# Patient Record
Sex: Female | Born: 1950 | Race: White | Hispanic: No | Marital: Married | Smoking: Never smoker
Health system: Southern US, Community
[De-identification: ages and names within clinical notes are randomized; demographics above are authoritative.]

## PROBLEM LIST (undated history)

## (undated) DIAGNOSIS — E079 Disorder of thyroid, unspecified: Secondary | ICD-10-CM

## (undated) DIAGNOSIS — E559 Vitamin D deficiency, unspecified: Secondary | ICD-10-CM

## (undated) DIAGNOSIS — I471 Supraventricular tachycardia, unspecified: Secondary | ICD-10-CM

## (undated) HISTORY — DX: Disorder of thyroid, unspecified: E07.9

## (undated) HISTORY — DX: Vitamin D deficiency, unspecified: E55.9

---

## 2000-06-01 ENCOUNTER — Other Ambulatory Visit: Admission: RE | Admit: 2000-06-01 | Discharge: 2000-06-01 | Payer: Self-pay | Admitting: *Deleted

## 2001-09-01 ENCOUNTER — Other Ambulatory Visit: Admission: RE | Admit: 2001-09-01 | Discharge: 2001-09-01 | Payer: Self-pay | Admitting: *Deleted

## 2003-07-17 ENCOUNTER — Other Ambulatory Visit: Admission: RE | Admit: 2003-07-17 | Discharge: 2003-07-17 | Payer: Self-pay | Admitting: *Deleted

## 2013-12-20 DIAGNOSIS — N952 Postmenopausal atrophic vaginitis: Secondary | ICD-10-CM | POA: Diagnosis not present

## 2013-12-20 DIAGNOSIS — N950 Postmenopausal bleeding: Secondary | ICD-10-CM | POA: Diagnosis not present

## 2013-12-21 DIAGNOSIS — N950 Postmenopausal bleeding: Secondary | ICD-10-CM | POA: Diagnosis not present

## 2014-01-08 DIAGNOSIS — Z23 Encounter for immunization: Secondary | ICD-10-CM | POA: Diagnosis not present

## 2014-01-12 DIAGNOSIS — N3020 Other chronic cystitis without hematuria: Secondary | ICD-10-CM | POA: Diagnosis not present

## 2014-01-12 DIAGNOSIS — R319 Hematuria, unspecified: Secondary | ICD-10-CM | POA: Diagnosis not present

## 2014-01-16 DIAGNOSIS — N841 Polyp of cervix uteri: Secondary | ICD-10-CM | POA: Diagnosis not present

## 2014-01-16 DIAGNOSIS — N950 Postmenopausal bleeding: Secondary | ICD-10-CM | POA: Diagnosis not present

## 2014-01-17 DIAGNOSIS — N841 Polyp of cervix uteri: Secondary | ICD-10-CM | POA: Diagnosis not present

## 2014-02-14 DIAGNOSIS — E559 Vitamin D deficiency, unspecified: Secondary | ICD-10-CM | POA: Diagnosis not present

## 2014-02-14 DIAGNOSIS — E039 Hypothyroidism, unspecified: Secondary | ICD-10-CM | POA: Diagnosis not present

## 2014-06-13 DIAGNOSIS — M24573 Contracture, unspecified ankle: Secondary | ICD-10-CM | POA: Diagnosis not present

## 2014-06-13 DIAGNOSIS — M898X7 Other specified disorders of bone, ankle and foot: Secondary | ICD-10-CM | POA: Diagnosis not present

## 2014-06-13 DIAGNOSIS — M7660 Achilles tendinitis, unspecified leg: Secondary | ICD-10-CM | POA: Diagnosis not present

## 2014-06-18 DIAGNOSIS — R262 Difficulty in walking, not elsewhere classified: Secondary | ICD-10-CM | POA: Diagnosis not present

## 2014-06-18 DIAGNOSIS — M7661 Achilles tendinitis, right leg: Secondary | ICD-10-CM | POA: Diagnosis not present

## 2014-06-18 DIAGNOSIS — M25571 Pain in right ankle and joints of right foot: Secondary | ICD-10-CM | POA: Diagnosis not present

## 2014-06-20 DIAGNOSIS — R262 Difficulty in walking, not elsewhere classified: Secondary | ICD-10-CM | POA: Diagnosis not present

## 2014-06-20 DIAGNOSIS — M7661 Achilles tendinitis, right leg: Secondary | ICD-10-CM | POA: Diagnosis not present

## 2014-06-20 DIAGNOSIS — M25571 Pain in right ankle and joints of right foot: Secondary | ICD-10-CM | POA: Diagnosis not present

## 2014-06-22 DIAGNOSIS — M7661 Achilles tendinitis, right leg: Secondary | ICD-10-CM | POA: Diagnosis not present

## 2014-06-22 DIAGNOSIS — R262 Difficulty in walking, not elsewhere classified: Secondary | ICD-10-CM | POA: Diagnosis not present

## 2014-06-22 DIAGNOSIS — M25571 Pain in right ankle and joints of right foot: Secondary | ICD-10-CM | POA: Diagnosis not present

## 2014-06-26 DIAGNOSIS — R262 Difficulty in walking, not elsewhere classified: Secondary | ICD-10-CM | POA: Diagnosis not present

## 2014-06-26 DIAGNOSIS — M7661 Achilles tendinitis, right leg: Secondary | ICD-10-CM | POA: Diagnosis not present

## 2014-06-26 DIAGNOSIS — M25571 Pain in right ankle and joints of right foot: Secondary | ICD-10-CM | POA: Diagnosis not present

## 2014-06-28 DIAGNOSIS — M7661 Achilles tendinitis, right leg: Secondary | ICD-10-CM | POA: Diagnosis not present

## 2014-06-28 DIAGNOSIS — M25571 Pain in right ankle and joints of right foot: Secondary | ICD-10-CM | POA: Diagnosis not present

## 2014-06-28 DIAGNOSIS — R262 Difficulty in walking, not elsewhere classified: Secondary | ICD-10-CM | POA: Diagnosis not present

## 2014-07-05 DIAGNOSIS — R262 Difficulty in walking, not elsewhere classified: Secondary | ICD-10-CM | POA: Diagnosis not present

## 2014-07-05 DIAGNOSIS — M7661 Achilles tendinitis, right leg: Secondary | ICD-10-CM | POA: Diagnosis not present

## 2014-07-05 DIAGNOSIS — M25571 Pain in right ankle and joints of right foot: Secondary | ICD-10-CM | POA: Diagnosis not present

## 2014-07-09 DIAGNOSIS — M25571 Pain in right ankle and joints of right foot: Secondary | ICD-10-CM | POA: Diagnosis not present

## 2014-07-09 DIAGNOSIS — R262 Difficulty in walking, not elsewhere classified: Secondary | ICD-10-CM | POA: Diagnosis not present

## 2014-07-09 DIAGNOSIS — M7661 Achilles tendinitis, right leg: Secondary | ICD-10-CM | POA: Diagnosis not present

## 2014-07-12 DIAGNOSIS — M7661 Achilles tendinitis, right leg: Secondary | ICD-10-CM | POA: Diagnosis not present

## 2014-07-12 DIAGNOSIS — M25571 Pain in right ankle and joints of right foot: Secondary | ICD-10-CM | POA: Diagnosis not present

## 2014-07-12 DIAGNOSIS — R262 Difficulty in walking, not elsewhere classified: Secondary | ICD-10-CM | POA: Diagnosis not present

## 2014-07-16 DIAGNOSIS — M7661 Achilles tendinitis, right leg: Secondary | ICD-10-CM | POA: Diagnosis not present

## 2014-07-16 DIAGNOSIS — R262 Difficulty in walking, not elsewhere classified: Secondary | ICD-10-CM | POA: Diagnosis not present

## 2014-07-16 DIAGNOSIS — M25571 Pain in right ankle and joints of right foot: Secondary | ICD-10-CM | POA: Diagnosis not present

## 2014-07-18 DIAGNOSIS — M25571 Pain in right ankle and joints of right foot: Secondary | ICD-10-CM | POA: Diagnosis not present

## 2014-07-18 DIAGNOSIS — R262 Difficulty in walking, not elsewhere classified: Secondary | ICD-10-CM | POA: Diagnosis not present

## 2014-07-18 DIAGNOSIS — M7661 Achilles tendinitis, right leg: Secondary | ICD-10-CM | POA: Diagnosis not present

## 2014-07-24 DIAGNOSIS — M7661 Achilles tendinitis, right leg: Secondary | ICD-10-CM | POA: Diagnosis not present

## 2014-07-24 DIAGNOSIS — M25571 Pain in right ankle and joints of right foot: Secondary | ICD-10-CM | POA: Diagnosis not present

## 2014-07-24 DIAGNOSIS — R262 Difficulty in walking, not elsewhere classified: Secondary | ICD-10-CM | POA: Diagnosis not present

## 2014-08-15 DIAGNOSIS — M6701 Short Achilles tendon (acquired), right ankle: Secondary | ICD-10-CM | POA: Diagnosis not present

## 2014-08-15 DIAGNOSIS — M2011 Hallux valgus (acquired), right foot: Secondary | ICD-10-CM | POA: Diagnosis not present

## 2014-08-15 DIAGNOSIS — M7661 Achilles tendinitis, right leg: Secondary | ICD-10-CM | POA: Diagnosis not present

## 2014-08-15 DIAGNOSIS — M9261 Juvenile osteochondrosis of tarsus, right ankle: Secondary | ICD-10-CM | POA: Diagnosis not present

## 2014-08-28 DIAGNOSIS — M7661 Achilles tendinitis, right leg: Secondary | ICD-10-CM | POA: Diagnosis not present

## 2014-09-11 DIAGNOSIS — M7661 Achilles tendinitis, right leg: Secondary | ICD-10-CM | POA: Diagnosis not present

## 2014-12-20 DIAGNOSIS — Z23 Encounter for immunization: Secondary | ICD-10-CM | POA: Diagnosis not present

## 2015-02-18 DIAGNOSIS — R319 Hematuria, unspecified: Secondary | ICD-10-CM | POA: Diagnosis not present

## 2015-02-18 DIAGNOSIS — E785 Hyperlipidemia, unspecified: Secondary | ICD-10-CM | POA: Diagnosis not present

## 2015-02-18 DIAGNOSIS — E559 Vitamin D deficiency, unspecified: Secondary | ICD-10-CM | POA: Diagnosis not present

## 2015-02-18 DIAGNOSIS — E039 Hypothyroidism, unspecified: Secondary | ICD-10-CM | POA: Diagnosis not present

## 2015-02-26 DIAGNOSIS — E039 Hypothyroidism, unspecified: Secondary | ICD-10-CM | POA: Diagnosis not present

## 2015-02-26 DIAGNOSIS — I491 Atrial premature depolarization: Secondary | ICD-10-CM | POA: Diagnosis not present

## 2015-02-26 DIAGNOSIS — L309 Dermatitis, unspecified: Secondary | ICD-10-CM | POA: Diagnosis not present

## 2015-02-26 DIAGNOSIS — E559 Vitamin D deficiency, unspecified: Secondary | ICD-10-CM | POA: Diagnosis not present

## 2015-02-26 DIAGNOSIS — N390 Urinary tract infection, site not specified: Secondary | ICD-10-CM | POA: Diagnosis not present

## 2015-02-26 DIAGNOSIS — Z1231 Encounter for screening mammogram for malignant neoplasm of breast: Secondary | ICD-10-CM | POA: Diagnosis not present

## 2015-04-23 DIAGNOSIS — Z1231 Encounter for screening mammogram for malignant neoplasm of breast: Secondary | ICD-10-CM | POA: Diagnosis not present

## 2015-04-30 DIAGNOSIS — Z1212 Encounter for screening for malignant neoplasm of rectum: Secondary | ICD-10-CM | POA: Diagnosis not present

## 2015-04-30 DIAGNOSIS — Z1211 Encounter for screening for malignant neoplasm of colon: Secondary | ICD-10-CM | POA: Diagnosis not present

## 2015-06-18 DIAGNOSIS — S99921A Unspecified injury of right foot, initial encounter: Secondary | ICD-10-CM | POA: Diagnosis not present

## 2015-06-18 DIAGNOSIS — S93401A Sprain of unspecified ligament of right ankle, initial encounter: Secondary | ICD-10-CM | POA: Diagnosis not present

## 2015-07-24 DIAGNOSIS — E785 Hyperlipidemia, unspecified: Secondary | ICD-10-CM | POA: Diagnosis not present

## 2015-07-24 DIAGNOSIS — E559 Vitamin D deficiency, unspecified: Secondary | ICD-10-CM | POA: Diagnosis not present

## 2015-07-24 DIAGNOSIS — E039 Hypothyroidism, unspecified: Secondary | ICD-10-CM | POA: Diagnosis not present

## 2015-10-10 ENCOUNTER — Ambulatory Visit (INDEPENDENT_AMBULATORY_CARE_PROVIDER_SITE_OTHER): Payer: Medicare Other | Admitting: Internal Medicine

## 2015-10-10 ENCOUNTER — Encounter: Payer: Self-pay | Admitting: Internal Medicine

## 2015-10-10 VITALS — BP 132/64 | HR 77 | Ht 67.0 in | Wt 194.5 lb

## 2015-10-10 DIAGNOSIS — R002 Palpitations: Secondary | ICD-10-CM | POA: Diagnosis not present

## 2015-10-10 DIAGNOSIS — R55 Syncope and collapse: Secondary | ICD-10-CM

## 2015-10-10 MED ORDER — METOPROLOL SUCCINATE ER 25 MG PO TB24: 25.0000 mg | ORAL_TABLET | Freq: Every day | ORAL | Status: DC

## 2015-10-10 MED ORDER — PROPRANOLOL HCL ER 60 MG PO CP24: 60.0000 mg | ORAL_CAPSULE | Freq: Every day | ORAL | Status: DC

## 2015-10-10 MED ORDER — ATENOLOL 25 MG PO TABS: 25.0000 mg | ORAL_TABLET | Freq: Every day | ORAL | Status: DC

## 2015-10-10 NOTE — Progress Notes (Signed)
ELECTROPHYSIOLOGY CONSULT NOTE  Patient ID: Wanda Freeman, MRN: [id number], DOB/AGE: [birth year] 65 y.o. Admit date: (Not on file) Date of Consult: 10/10/2015  Primary Physician: Ernestene Kiel, MD Primary Cardiologist: new Consulting Physician nine  Chief Complaint: Syncope and PACs      HPI Wanda Freeman is a 65 y.o. female  Seen at the request of her husband (Dr. Lavell Luster Pore) because of atypical chest pain as well as palpitations.  Her palpitations have been long-standing. 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 When these persist for periods of time she gets a chest aching with it. It is unassociated with lightheadedness. Neither she nor her husband has ever felt that she has sustained tachycardia.  These are occurring daily now with increasing duration and increasing discombobulation. They seem to be aggravated by stress but there also notable particularly bothersome at night when she goes to bed. On one occasion in the remote past she took a single dose of metoprolol with some benefit.  More recently she had an episode of quite severe left-sided chest pain. It lasted 2-3 minutes and was followed by nausea and lightheadedness. It was decrescendo. It occurred after having traveled which included an airplane flight from [place].  She has a history of syncope a few months before that which occurred at the beauty [employer]lor. She been washing her hair she stood up and collapsed to the floor. In her mind the event was abrupt in onset as well as offset although there is some residual orthostatic intolerance. She was able to get herself back up into the chair to have her hair finished. She's had no other history of syncope.  Cardiac risk factors are negative for family history cholesterol or cigarette use.     Past Medical History  Diagnosis Date  . Thyroid disease   . Vitamin D  deficiency       Surgical History: History reviewed. No pertinent past surgical history.   Home Meds: Prior to Admission medications   Medication Sig Start Date End Date Taking? Authorizing Provider  atenolol (TENORMIN) 25 MG tablet Take 1 tablet (25 mg total) by mouth daily. 10/10/15   Deboraha Sprang, MD  levothyroxine (SYNTHROID, LEVOTHROID) 100 MCG tablet Take 100 mcg by mouth daily before breakfast.   Yes Historical Provider, MD  metoprolol succinate (TOPROL-XL) 25 MG 24 hr tablet Take 1 tablet (25 mg total) by mouth daily. 10/10/15   Deboraha Sprang, MD  propranolol ER (INDERAL LA) 60 MG 24 hr capsule Take 1 capsule (60 mg total) by mouth daily. 10/10/15   Deboraha Sprang, MD  Vitamin D, Ergocalciferol, (DRISDOL) 50000 units CAPS capsule Take 50,000 Units by mouth every 7 (seven) days.   Yes Historical Provider, MD    Allergies: No Known Allergies  Social History   Social History  . Marital Status: Unknown    Spouse Name: N/A  . Number of Children: N/A  . Years of Education: N/A   Occupational History  . Not on file.   Social History Main Topics  . Smoking status: Never Smoker   . Smokeless tobacco: Not on file  . Alcohol Use: Not on file  . Drug Use: No  . Sexual Activity: Not on file   Other Topics Concern  . Not on file   Social History Narrative  . No narrative on file     Family History  Problem  Relation Age of Onset  . Family history unknown: Yes     ROS:  Please see the history of present illness.     All other systems reviewed and negative.    Physical Exam:   Blood pressure 132/64, pulse 77, height 5\' 7"  (1.702 m), weight 194 lb 8 oz (88.225 kg). General: Well developed, well nourished female in no acute distress. Head: Normocephalic, atraumatic, sclera non-icteric, no xanthomas, nares are without discharge. EENT: normal  Lymph Nodes:  none Neck: Negative for carotid bruits. JVD not elevated. Back:without scoliosis kyphosis  Lungs: Clear bilaterally to  auscultation without wheezes, rales, or rhonchi. Breathing is unlabored. Heart: Some irregular RR with S1 S2. No murmur . No rubs, or gallops appreciated. Abdomen: Soft, non-tender, non-distended with normoactive bowel sounds. No hepatomegaly. No rebound/guarding. No obvious abdominal masses. Msk:  Strength and tone appear normal for age. Extremities: No clubbing or cyanosis. No edema.  Distal pedal pulses are 2+ and equal bilaterally. Skin: Warm and Dry Neuro: Alert and oriented X 3. CN III-XII intact Grossly normal sensory and motor function . Psych:  Responds to questions appropriately with a normal affect.      Labs: Cardiac Enzymes No results for input(s): CKTOTAL, CKMB, TROPONINI in the last 72 hours. CBC No results found for: WBC, HGB, HCT, MCV, PLT PROTIME: No results for input(s): LABPROT, INR in the last 72 hours. Chemistry No results for input(s): NA, K, CL, CO2, BUN, CREATININE, CALCIUM, PROT, BILITOT, ALKPHOS, ALT, AST, GLUCOSE in the last 168 hours.  Invalid input(s): LABALBU Lipids No results found for: CHOL, HDL, LDLCALC, TRIG BNP No results found for: PROBNP Thyroid Function Tests: No results for input(s): TSH, T4TOTAL, T3FREE, THYROIDAB in the last 72 hours.  Invalid input(s): FREET3 Miscellaneous No results found for: DDIMER  Radiology/Studies:  No results found.  EKG: Sinus at 77 Intervals 13/07/37 Otherwise normal  ECG dated 7/15 demonstrated sinus rhythm with PACs and a pattern of trigeminy   Assessment and Plan:  Symptomatic PACs  Chest pain atypical  Syncope   The patient has a long-standing of increasingly symptomatic PACs. They are now a daily problem. We will undertake a beta blocker trial realizing that in the past she has had low blood pressures and we worry a little bit about the impact. I've given her prescriptions for atenolol 25, metoprolol succinate 25 and Inderal LA 60. We have reviewed potential side effects.  Her chest pain is  atypical. However, it was associated with epiphenomena that are worrisome. We will undertake standard treadmill testing  Her syncope story contextually should be vasomotor; however, the abrupt onset offset nature of it raises the possibility of an arrhythmia.  We will undertake an echocardiogram to exclude structural heart disease.       Virl Axe

## 2015-10-10 NOTE — Patient Instructions (Addendum)
Medication Instructions: - Your physician has recommended you make the following change in your medication:  ** You have been given 3 different beta blockers to try. You may take these in any order, but DO NOT take more than one at a time. Please try to give at least 2 weeks on each one to see how you do.**  1) Atenolol 25 mg once daily 2) Metoprolol Succinate 25 mg once daily 3) Inderal LA 60 mg once daily  Labwork: - none  Procedures/Testing: - Your physician has requested that you have an echocardiogram. Echocardiography is a painless test that uses sound waves to create images of your heart. It provides your doctor with information about the size and shape of your heart and how well your heart's chambers and valves are working. This procedure takes approximately one hour. There are no restrictions for this procedure.  - Your physician has requested that you have an exercise tolerance test. For further information please visit [email or web address]. Please also follow instruction sheet, as given.  Prior to your treadmill test: - you may eat/ drink - DO NOT take beta blockers for 24 hours prior to your test, you make take your other medications - please do not wear perfumes/ lotions the morning of your test   Follow-Up: - Your physician recommends that you schedule a follow-up appointment in: 3 months with Dr. Caryl Comes.  Any Additional Special Instructions Will Be Listed Below (If Applicable).     If you need a refill on your cardiac medications before your next appointment, please call your pharmacy.

## 2015-10-31 ENCOUNTER — Ambulatory Visit (INDEPENDENT_AMBULATORY_CARE_PROVIDER_SITE_OTHER): Payer: Medicare Other

## 2015-10-31 ENCOUNTER — Other Ambulatory Visit: Payer: Self-pay

## 2015-10-31 DIAGNOSIS — R002 Palpitations: Secondary | ICD-10-CM

## 2015-10-31 DIAGNOSIS — R55 Syncope and collapse: Secondary | ICD-10-CM

## 2015-10-31 LAB — EXERCISE TOLERANCE TEST
CHL CUP STRESS STAGE 2 DBP: 83 mmHg
CHL CUP STRESS STAGE 2 GRADE: 0 %
CHL CUP STRESS STAGE 3 HR: 93 {beats}/min
CHL CUP STRESS STAGE 4 HR: 129 {beats}/min
CHL CUP STRESS STAGE 5 SBP: 194 mmHg
CHL CUP STRESS STAGE 5 SPEED: 2.5 mph
CHL CUP STRESS STAGE 6 GRADE: 0 %
CHL CUP STRESS STAGE 6 HR: 126 {beats}/min
CHL CUP STRESS STAGE 6 SPEED: 0 mph
CHL CUP STRESS STAGE 7 DBP: 83 mmHg
CHL CUP STRESS STAGE 7 SBP: 155 mmHg
CHL CUP STRESS STAGE 7 SPEED: 0 mph
CSEPHR: 106 %
CSEPPBP: 194 mmHg
CSEPPHR: 162 {beats}/min
CSEPPMHR: 105 %
Estimated workload: 7 METS
Exercise duration (min): 5 min
Exercise duration (sec): 28 s
MPHR: 154 {beats}/min
Rest HR: 78 {beats}/min
Stage 1 HR: 81 {beats}/min
Stage 2 HR: 91 {beats}/min
Stage 2 SBP: 148 mmHg
Stage 2 Speed: 0 mph
Stage 3 Grade: 0 %
Stage 3 Speed: 0 mph
Stage 4 DBP: 80 mmHg
Stage 4 Grade: 10 %
Stage 4 SBP: 197 mmHg
Stage 4 Speed: 1.7 mph
Stage 5 DBP: 98 mmHg
Stage 5 Grade: 12 %
Stage 5 HR: 162 {beats}/min
Stage 7 Grade: 0 %
Stage 7 HR: 96 {beats}/min

## 2015-12-01 ENCOUNTER — Other Ambulatory Visit: Payer: Self-pay | Admitting: Internal Medicine

## 2015-12-02 ENCOUNTER — Other Ambulatory Visit: Payer: Self-pay | Admitting: *Deleted

## 2015-12-02 MED ORDER — PROPRANOLOL HCL ER 60 MG PO CP24
60.0000 mg | ORAL_CAPSULE | Freq: Every day | ORAL | 2 refills | Status: DC
Start: 2015-12-02 — End: 2015-12-02

## 2015-12-02 MED ORDER — PROPRANOLOL HCL ER 60 MG PO CP24: 60.0000 mg | ORAL_CAPSULE | Freq: Every day | ORAL | 2 refills | Status: DC

## 2016-01-09 ENCOUNTER — Ambulatory Visit: Payer: PRIVATE HEALTH INSURANCE | Admitting: Internal Medicine

## 2016-01-30 DIAGNOSIS — R2231 Localized swelling, mass and lump, right upper limb: Secondary | ICD-10-CM | POA: Diagnosis not present

## 2016-02-03 DIAGNOSIS — Z23 Encounter for immunization: Secondary | ICD-10-CM | POA: Diagnosis not present

## 2016-02-06 ENCOUNTER — Encounter: Payer: Self-pay | Admitting: Internal Medicine

## 2016-02-06 ENCOUNTER — Ambulatory Visit (INDEPENDENT_AMBULATORY_CARE_PROVIDER_SITE_OTHER): Payer: Medicare Other | Admitting: Internal Medicine

## 2016-02-06 VITALS — BP 132/80 | HR 68 | Ht 68.0 in | Wt 193.8 lb

## 2016-02-06 DIAGNOSIS — I491 Atrial premature depolarization: Secondary | ICD-10-CM

## 2016-02-06 DIAGNOSIS — R002 Palpitations: Secondary | ICD-10-CM | POA: Diagnosis not present

## 2016-02-06 DIAGNOSIS — R55 Syncope and collapse: Secondary | ICD-10-CM

## 2016-02-06 MED ORDER — DILTIAZEM HCL 30 MG PO TABS: ORAL_TABLET | ORAL | 0 refills | Status: DC

## 2016-02-06 MED ORDER — METOPROLOL SUCCINATE ER 25 MG PO TB24: 25.0000 mg | ORAL_TABLET | Freq: Every day | ORAL | 3 refills | Status: DC

## 2016-02-06 NOTE — Progress Notes (Signed)
      Patient Care Team: Ernestene Kiel, MD as PCP - General (Internal Medicine)   HPI  Wanda Freeman is a 65 y.o. female Seen in follow-up for symptomatic PACs for which we undertook beta blocker trials 7/17. Drug doses were targeted low because of a history of hypertension  PACs are somewhat better.  She continues with some chest pain.  She had a history of syncope for which we undertook an echo to exclude structural heart disease. She also had atypical chest pain and she underwent treadmill testing which was normal.  Records and Results Reviewed 7/17 echocardiogram normal 7/17 stress test data personally reviewed target heart rate achieved at 5 minutes and 28 seconds and the test terminated. Blood pressure systolic [id number]. Baseline ST segments without significant change  Past Medical History:  Diagnosis Date  . Thyroid disease   . Vitamin D deficiency     History reviewed. No pertinent surgical history.  Current Outpatient Prescriptions  Medication Sig Dispense Refill  . levothyroxine (SYNTHROID, LEVOTHROID) 100 MCG tablet Take 100 mcg by mouth daily before breakfast.    . metoprolol succinate (TOPROL-XL) 25 MG 24 hr tablet Take 1 tablet (25 mg total) by mouth daily. 30 tablet 0  . Vitamin D, Ergocalciferol, (DRISDOL) 50000 units CAPS capsule Take 50,000 Units by mouth every 7 (seven) days.     No current facility-administered medications for this visit.     No Known Allergies    Review of Systems negative except from HPI and PMH  Physical Exam BP 132/80   Pulse 68   Ht 5\' 8"  (1.727 m)   Wt 193 lb 12.8 oz (87.9 kg)   SpO2 97%   BMI 29.47 kg/m  Well developed and well nourished in no acute distress HENT normal E scleral and icterus clear Neck Supple JVP flat; carotids brisk and full Clear to ausculation Irregular rate and rhythm, no murmurs gallops or rub Soft with active bowel sounds No clubbing cyanosis  Edema Alert and oriented, grossly normal  motor and sensory function Skin Warm and Dry  ECG demonstrates sinus rhythm at 73 with frequent PACs and in a pattern of trigeminy 13/08/36 PA-C P-wave morphology is biphasic negative/positive in lead V1  Assessment and  Plan PACs  Chest pain-atypical   The patient has ongoing problems with increasingly symptomatic PACs. She has tolerated metoprolol. She uses it as needed. I've also given her prescription today for diltiazem 30 mg to try as an alternative on an as-needed basis. We reviewed the possibility of flecainide. The symptoms are not justifying of that approach at this time  We spent more than 50% of our >25 min visit in face to face counseling regarding the above       Current medicines are reviewed at length with the patient today .  The patient does not  have concerns regarding medicines.

## 2016-02-06 NOTE — Patient Instructions (Signed)
Medication Instructions: - Your physician has recommended you make the following change in your medication:  1) Take diltiazem 30 mg one tablet by mouth once every hour x 3 doses as needed for palpitations  Labwork: - none ordered  Procedures/Testing: - none ordered  Follow-Up: - Your physician wants you to follow-up in: 1 year with Dr. Caryl Comes. You will receive a reminder letter in the mail two months in advance. If you don't receive a letter, please call our office to schedule the follow-up appointment.  Any Additional Special Instructions Will Be Listed Below (If Applicable).     If you need a refill on your cardiac medications before your next appointment, please call your pharmacy.

## 2016-02-13 DIAGNOSIS — I8311 Varicose veins of right lower extremity with inflammation: Secondary | ICD-10-CM | POA: Diagnosis not present

## 2016-02-13 DIAGNOSIS — I8312 Varicose veins of left lower extremity with inflammation: Secondary | ICD-10-CM | POA: Diagnosis not present

## 2016-02-13 DIAGNOSIS — I83813 Varicose veins of bilateral lower extremities with pain: Secondary | ICD-10-CM | POA: Diagnosis not present

## 2016-02-18 ENCOUNTER — Other Ambulatory Visit: Payer: Self-pay | Admitting: *Deleted

## 2016-02-18 DIAGNOSIS — I83893 Varicose veins of bilateral lower extremities with other complications: Secondary | ICD-10-CM

## 2016-03-27 ENCOUNTER — Encounter: Payer: Self-pay | Admitting: Vascular Surgery

## 2016-04-14 ENCOUNTER — Encounter: Payer: Self-pay | Admitting: Vascular Surgery

## 2016-04-14 ENCOUNTER — Ambulatory Visit (INDEPENDENT_AMBULATORY_CARE_PROVIDER_SITE_OTHER): Payer: Medicare Other | Admitting: Vascular Surgery

## 2016-04-14 ENCOUNTER — Ambulatory Visit (HOSPITAL_COMMUNITY)
Admission: RE | Admit: 2016-04-14 | Discharge: 2016-04-14 | Disposition: A | Discharge status: Home / Self Care | Payer: Medicare Other | Source: Ambulatory Visit | Attending: Vascular Surgery | Admitting: Vascular Surgery

## 2016-04-14 VITALS — BP 121/73 | HR 86 | Temp 97.6°F | Resp 18 | Ht 68.0 in | Wt 195.5 lb

## 2016-04-14 DIAGNOSIS — I83893 Varicose veins of bilateral lower extremities with other complications: Secondary | ICD-10-CM | POA: Insufficient documentation

## 2016-04-14 DIAGNOSIS — M7989 Other specified soft tissue disorders: Secondary | ICD-10-CM | POA: Diagnosis present

## 2016-04-14 NOTE — Progress Notes (Signed)
Subjective:     Patient ID: Wanda Freeman, female   DOB: 1950-07-22, 66 y.o.   MRN: [id number]  HPI This 66 year old female is evaluated for extensive painful varicosities in both lower extremities. Patient has a remote history of bilateral great saphenous vein stripping done by Dr. Glade Nurse proximally to 20-30 years ago. Patient has gradually developed extensive bulging varicosities in both legs. This causes aching throbbing and burning discomfort. She has been wearing long leg elastic compression stockings 30-40 millimeter gradient for the last 6 months with no improvement in her symptoms. She has no history of DVT thrombophlebitis stasis ulcers or bleeding. Her symptoms are affecting her daily living.  Past Medical History:  Diagnosis Date  . Thyroid disease   . Vitamin D deficiency     Social History  Substance Use Topics  . Smoking status: Never Smoker  . Smokeless tobacco: Never Used  . Alcohol use Not on file    Family History  Problem Relation Age of Onset  . Family history unknown: Yes    No Known Allergies   Current Outpatient Prescriptions:  .  diltiazem (CARDIZEM) 30 MG tablet, Take one tablet (30 mg) by mouth once every hour x 3 doses as needed for palpitatons, Disp: 30 tablet, Rfl: 0 .  levothyroxine (SYNTHROID, LEVOTHROID) 100 MCG tablet, Take 100 mcg by mouth daily before breakfast., Disp: , Rfl:  .  metoprolol succinate (TOPROL-XL) 25 MG 24 hr tablet, Take 1 tablet (25 mg total) by mouth daily. (Patient taking differently: Take 25 mg by mouth 3 times/day as needed-between meals & bedtime. ), Disp: 90 tablet, Rfl: 3 .  Vitamin D, Ergocalciferol, (DRISDOL) 50000 units CAPS capsule, Take 50,000 Units by mouth every 7 (seven) days., Disp: , Rfl:   Vitals:   04/14/16 1519  BP: 121/73  Pulse: 86  Resp: 18  Temp: 97.6 F (36.4 C)  TempSrc: Oral  SpO2: 96%  Weight: 195 lb 8 oz (88.7 kg)  Height: 5\' 8"  (1.727 m)    Body mass index is 29.73  kg/m.         Review of Systems Denies chest pain, dyspnea on exertion, PND, orthopnea, hemoptysis, claudication    Objective:   Physical Exam BP 121/73 (BP Location: Left Arm, Patient Position: Sitting, Cuff Size: Normal)   Pulse 86   Temp 97.6 F (36.4 C) (Oral)   Resp 18   Ht 5\' 8"  (1.727 m)   Wt 195 lb 8 oz (88.7 kg)   SpO2 96%   BMI 29.73 kg/m     Gen.-alert and oriented x3 in no apparent distress HEENT normal for age Lungs no rhonchi or wheezing Cardiovascular regular rhythm no murmurs carotid pulses 3+ palpable no bruits audible Abdomen soft nontender no palpable masses Musculoskeletal free of  major deformities Skin clear -no rashes Neurologic normal Lower extremities 3+ femoral and dorsalis pedis pulses palpable bilaterally with no edema Both lower extremities have extensive bulging varicosities beginning in the anterior medial thigh extending medially and laterally into the lateral and medial calf and posteriorly down to the ankle level. No active ulcer. No hyperpigmentation noted.  Today I ordered bilateral venous reflux exam which I reviewed and interpreted. There is no DVT. There is absent great saphenous vein bilaterally consistent with previous stripping. There is extensive reflux in these bulging varicosities in both legs which extends up to the saphenofemoral junction      Assessment:     Painful recurrent varicosities bilaterally-status post great saphenous vein stripping  bilaterally 20-30 years ago-causing symptoms which are affecting patient's daily living and are resistant to conservative measures including long-leg elastic compression stockings 30-40 millimeter gradient, elevation, and ibuprofen    Plan:     Patient needs #1 multiple stab phlebectomy of painful varicosities left leg-greater than 20 followed by #2 greater than 20 stab phlebectomy of painful varicosities right leg  We will proceed with this procedure in the near future to  hopefully alleviate most of her symptoms.

## 2016-04-17 DIAGNOSIS — E559 Vitamin D deficiency, unspecified: Secondary | ICD-10-CM | POA: Diagnosis not present

## 2016-04-17 DIAGNOSIS — E785 Hyperlipidemia, unspecified: Secondary | ICD-10-CM | POA: Diagnosis not present

## 2016-04-17 DIAGNOSIS — Z87440 Personal history of urinary (tract) infections: Secondary | ICD-10-CM | POA: Diagnosis not present

## 2016-04-17 DIAGNOSIS — N390 Urinary tract infection, site not specified: Secondary | ICD-10-CM | POA: Diagnosis not present

## 2016-04-17 DIAGNOSIS — R300 Dysuria: Secondary | ICD-10-CM | POA: Diagnosis not present

## 2016-04-17 DIAGNOSIS — E039 Hypothyroidism, unspecified: Secondary | ICD-10-CM | POA: Diagnosis not present

## 2016-04-17 DIAGNOSIS — I491 Atrial premature depolarization: Secondary | ICD-10-CM | POA: Diagnosis not present

## 2016-04-17 DIAGNOSIS — R2231 Localized swelling, mass and lump, right upper limb: Secondary | ICD-10-CM | POA: Diagnosis not present

## 2016-04-17 DIAGNOSIS — I8390 Asymptomatic varicose veins of unspecified lower extremity: Secondary | ICD-10-CM | POA: Diagnosis not present

## 2016-05-06 DIAGNOSIS — N6331 Unspecified lump in axillary tail of the right breast: Secondary | ICD-10-CM | POA: Diagnosis not present

## 2016-05-06 DIAGNOSIS — R928 Other abnormal and inconclusive findings on diagnostic imaging of breast: Secondary | ICD-10-CM | POA: Diagnosis not present

## 2016-05-06 DIAGNOSIS — N6311 Unspecified lump in the right breast, upper outer quadrant: Secondary | ICD-10-CM | POA: Diagnosis not present

## 2016-05-06 DIAGNOSIS — N6489 Other specified disorders of breast: Secondary | ICD-10-CM | POA: Diagnosis not present

## 2016-05-06 DIAGNOSIS — N6321 Unspecified lump in the left breast, upper outer quadrant: Secondary | ICD-10-CM | POA: Diagnosis not present

## 2016-05-06 DIAGNOSIS — R2231 Localized swelling, mass and lump, right upper limb: Secondary | ICD-10-CM | POA: Diagnosis not present

## 2016-05-15 ENCOUNTER — Encounter: Payer: Self-pay | Admitting: Vascular Surgery

## 2016-05-18 ENCOUNTER — Encounter: Payer: Self-pay | Admitting: Vascular Surgery

## 2016-05-25 ENCOUNTER — Ambulatory Visit (INDEPENDENT_AMBULATORY_CARE_PROVIDER_SITE_OTHER): Payer: Medicare Other | Admitting: Vascular Surgery

## 2016-05-25 ENCOUNTER — Encounter: Payer: Self-pay | Admitting: Vascular Surgery

## 2016-05-25 VITALS — BP 131/76 | HR 82 | Temp 97.5°F | Resp 18 | Ht 67.0 in | Wt 189.0 lb

## 2016-05-25 DIAGNOSIS — I83892 Varicose veins of left lower extremities with other complications: Secondary | ICD-10-CM

## 2016-05-25 HISTORY — PX: OTHER SURGICAL HISTORY: SHX169

## 2016-05-25 NOTE — Progress Notes (Signed)
Subjective:     Patient ID: Wanda Freeman, female   DOB: Jan 19, 1951, 66 y.o.   MRN: [id number]  HPI This 66 year old female had multiple stab phlebectomy-approximately 40 of painful varicosities left leg removed under local tumescent anesthesia. She tolerated the procedure well.  Review of Systems     Objective:   Physical Exam BP 131/76 (BP Location: Right Arm, Patient Position: Sitting, Cuff Size: Large)   Pulse 82   Temp 97.5 F (36.4 C) (Oral)   Resp 18   Ht 5\' 7"  (1.702 m)   Wt 189 lb (85.7 kg)   SpO2 95%   BMI 29.60 kg/m        Assessment:     Well-tolerated multiple stab phlebectomy-approximately 40-of painful varicosities left leg performed under local tumescent anesthesia    Plan:     Return in 2 weeks for similar procedure and contralateral right leg

## 2016-05-25 NOTE — Progress Notes (Signed)
    Stab Phlebectomy Procedure  Wanda Freeman DOB:03/26/1951  05/25/2016  Consent signed: Yes  Surgeon:J.D. Kellie Simmering, MD  Procedure: stab phlebectomy: left leg  BP 131/76 (BP Location: Right Arm, Patient Position: Sitting, Cuff Size: Large)   Pulse 82   Temp 97.5 F (36.4 C) (Oral)   Resp 18   Ht 5\' 7"  (1.702 m)   Wt 189 lb (85.7 kg)   SpO2 95%   BMI 29.60 kg/m   Start time: 11:00am   End time: 12:00pm   Tumescent Anesthesia: 325 cc 0.9% NaCl with 50 cc Lidocaine HCL with 1% Epi and 15 cc 8.4% NaHCO3  Local Anesthesia: 8 cc Lidocaine HCL and NaHCO3 (ratio 2:1)    Stab Phlebectomy: > 20 incisions Sites: Thigh and Calf  Patient tolerated procedure well: Yes    Description of Procedure:  After marking the course of the secondary varicosities, the patient was placed on the operating table in the supine position, and the left leg was prepped and draped in sterile fashion.    The patient was then put into Trendelenburg position.  Local anesthetic was administered at the previously marked varicosities, and tumescent anesthesia was administered around the vessels.  Greater than 20 stab wounds were made using the tip of an 11 blade. And using the vein hook, the phlebectomies were performed using a hemostat to avulse the varicosities.  Adequate hemostasis was achieved, and steri strips were applied to the stab wound.      ABD pads and thigh high compression stockings were applied as well ace wraps where needed. Blood loss was less than 15 cc.  The patient ambulated out of the operating room having tolerated the procedure well.

## 2016-05-28 ENCOUNTER — Encounter: Payer: Self-pay | Admitting: Vascular Surgery

## 2016-06-02 ENCOUNTER — Encounter: Payer: Self-pay | Admitting: Vascular Surgery

## 2016-06-08 ENCOUNTER — Encounter: Payer: Self-pay | Admitting: Vascular Surgery

## 2016-06-08 ENCOUNTER — Ambulatory Visit (INDEPENDENT_AMBULATORY_CARE_PROVIDER_SITE_OTHER): Payer: Medicare Other | Admitting: Vascular Surgery

## 2016-06-08 VITALS — BP 121/64 | HR 84 | Temp 97.4°F | Resp 16 | Ht 67.0 in | Wt 189.0 lb

## 2016-06-08 DIAGNOSIS — I83891 Varicose veins of right lower extremities with other complications: Secondary | ICD-10-CM | POA: Diagnosis not present

## 2016-06-08 HISTORY — PX: OTHER SURGICAL HISTORY: SHX169

## 2016-06-08 NOTE — Progress Notes (Signed)
Subjective:     Patient ID: VIVION HAUGH, female   DOB: May 24, 1950, 66 y.o.   MRN: [id number]  HPI This 66 year old female had multiple stab phlebectomy-approximately 40-of painful varicosities in the right leg performed under local tumescent anesthesia. She tolerated the procedure well.  Review of Systems     Objective:   Physical Exam BP 121/64 (BP Location: Right Arm, Patient Position: Sitting, Cuff Size: Large)   Pulse 84   Temp 97.4 F (36.3 C) (Oral)   Resp 16   Ht 5\' 7"  (1.702 m)   Wt 189 lb (85.7 kg)   SpO2 97%   BMI 29.60 kg/m        Assessment:     Well-tolerated multiple stab phlebectomy-approximately 40-of painful varicosities right leg performed under local tumescent anesthesia    Plan:     Return in 3 months for final follow-up

## 2016-06-08 NOTE — Progress Notes (Signed)
    Stab Phlebectomy Procedure  Wanda Freeman DOB:08/10/1950  06/08/2016  Consent signed: Yes  Surgeon:J.D. Kellie Simmering, MD  Procedure: stab phlebectomy: right leg  BP 121/64 (BP Location: Right Arm, Patient Position: Sitting, Cuff Size: Large)   Pulse 84   Temp 97.4 F (36.3 C) (Oral)   Resp 16   Ht 5\' 7"  (1.702 m)   Wt 189 lb (85.7 kg)   SpO2 97%   BMI 29.60 kg/m   Start time: 9:00am   End time: 9:50am   Tumescent Anesthesia: 325 cc 0.9% NaCl with 50 cc Lidocaine HCL with 1% Epi and 15 cc 8.4% NaHCO3  Local Anesthesia: 6 cc Lidocaine HCL and NaHCO3 (ratio 2:1)    Stab Phlebectomy: >20 Sites: Thigh and Calf  Patient tolerated procedure well: Yes    Description of Procedure:  After marking the course of the secondary varicosities, the patient was placed on the operating table in the supine position, and the right leg was prepped and draped in sterile fashion.    The patient was then put into Trendelenburg position.  Local anesthetic was administered at the previously marked varicosities, and tumescent anesthesia was administered around the vessels.  Greater than 20 stab wounds were made using the tip of an 11 blade. And using the vein hook, the phlebectomies were performed using a hemostat to avulse the varicosities.  Adequate hemostasis was achieved, and steri strips were applied to the stab wound.      ABD pads and thigh high compression stockings were applied as well ace wraps where needed. Blood loss was less than 15 cc.  The patient ambulated out of the operating room having tolerated the procedure well.

## 2016-06-15 ENCOUNTER — Other Ambulatory Visit: Payer: PRIVATE HEALTH INSURANCE | Admitting: Vascular Surgery

## 2016-09-07 ENCOUNTER — Ambulatory Visit: Payer: PRIVATE HEALTH INSURANCE | Admitting: Vascular Surgery

## 2016-09-09 ENCOUNTER — Encounter: Payer: Self-pay | Admitting: Vascular Surgery

## 2016-09-14 ENCOUNTER — Encounter: Payer: Self-pay | Admitting: Vascular Surgery

## 2016-09-14 ENCOUNTER — Ambulatory Visit (INDEPENDENT_AMBULATORY_CARE_PROVIDER_SITE_OTHER): Payer: Medicare Other | Admitting: Vascular Surgery

## 2016-09-14 VITALS — BP 106/62 | HR 83 | Temp 97.6°F | Resp 18 | Ht 67.0 in | Wt 185.0 lb

## 2016-09-14 DIAGNOSIS — I83893 Varicose veins of bilateral lower extremities with other complications: Secondary | ICD-10-CM | POA: Diagnosis not present

## 2016-09-14 NOTE — Progress Notes (Signed)
Subjective:     Patient ID: Wanda Freeman, female   DOB: 07-Oct-1950, 66 y.o.   MRN: [id number]  HPI This 66 year old female returns for final follow-up regarding her multiple stab phlebectomy procedures on both lower extremities. Patient had a remote history of bilateral great saphenous vein stripping by Dr. Glade Nurse 30 years ago. She had developed extensive recurrent varicosities anteriorly and posteriorly in the thigh and calf areas and underwent multiple stab phlebectomy of both lower extremities. She is very pleased with her result. She states legs feel much less heavy and achy. She has no distal edema. She does occasionally wear her elastic compression stockings.  Past Medical History:  Diagnosis Date  . Thyroid disease   . Vitamin D deficiency     Social History  Substance Use Topics  . Smoking status: Never Smoker  . Smokeless tobacco: Never Used  . Alcohol use Not on file    Family History  Problem Relation Age of Onset  . Family history unknown: Yes    No Known Allergies   Current Outpatient Prescriptions:  .  diltiazem (CARDIZEM) 30 MG tablet, Take one tablet (30 mg) by mouth once every hour x 3 doses as needed for palpitatons, Disp: 30 tablet, Rfl: 0 .  levothyroxine (SYNTHROID, LEVOTHROID) 100 MCG tablet, Take 100 mcg by mouth daily before breakfast., Disp: , Rfl:  .  metoprolol succinate (TOPROL-XL) 25 MG 24 hr tablet, Take 1 tablet (25 mg total) by mouth daily. (Patient taking differently: Take 25 mg by mouth 3 times/day as needed-between meals & bedtime. ), Disp: 90 tablet, Rfl: 3 .  Vitamin D, Ergocalciferol, (DRISDOL) 50000 units CAPS capsule, Take 50,000 Units by mouth every 7 (seven) days., Disp: , Rfl:   Vitals:   09/14/16 1310  BP: 106/62  Pulse: 83  Resp: 18  Temp: 97.6 F (36.4 C)  TempSrc: Oral  SpO2: 94%  Weight: 185 lb (83.9 kg)  Height: 5\' 7"  (1.702 m)    Body mass index is 28.98 kg/m.         Review of Systems Denies chest pain,  dyspnea on exertion, PND, orthopnea, hemoptysis, claudication    Objective:   Physical Exam BP 106/62 (BP Location: Left Arm, Patient Position: Sitting, Cuff Size: Normal)   Pulse 83   Temp 97.6 F (36.4 C) (Oral)   Resp 18   Ht 5\' 7"  (1.702 m)   Wt 185 lb (83.9 kg)   SpO2 94%   BMI 28.98 kg/m   Gen. well-developed well-nourished female no apparent distress alert and oriented 3 Lungs no rhonchi or wheezing Cardiovascular regular rhythm no murmurs Bilateral lower extremities with no residual large bulging varicosities and no edema distally. 2+ dorsalis pedis pulse palpable bilaterally. She does have patches of spider veins in both thighs and both anteriorly and posteriorly which are not bothering her. No hyperpigmentation or ulceration noted distally.     Assessment:     Successful bilateral multiple stab phlebectomy of recurrent varicosities-30 years post bilateral great saphenous vein stripping-patient very pleased with result and currently asymptomatic    Plan:     Return to me on a when necessary basis

## 2016-10-05 DIAGNOSIS — Z85828 Personal history of other malignant neoplasm of skin: Secondary | ICD-10-CM | POA: Diagnosis not present

## 2016-10-05 DIAGNOSIS — L309 Dermatitis, unspecified: Secondary | ICD-10-CM | POA: Diagnosis not present

## 2016-10-05 DIAGNOSIS — L718 Other rosacea: Secondary | ICD-10-CM | POA: Diagnosis not present

## 2017-01-06 DIAGNOSIS — N950 Postmenopausal bleeding: Secondary | ICD-10-CM | POA: Diagnosis not present

## 2017-01-06 DIAGNOSIS — N813 Complete uterovaginal prolapse: Secondary | ICD-10-CM | POA: Diagnosis not present

## 2017-01-06 DIAGNOSIS — Z4689 Encounter for fitting and adjustment of other specified devices: Secondary | ICD-10-CM | POA: Diagnosis not present

## 2017-01-13 DIAGNOSIS — Z23 Encounter for immunization: Secondary | ICD-10-CM | POA: Diagnosis not present

## 2017-01-28 DIAGNOSIS — N814 Uterovaginal prolapse, unspecified: Secondary | ICD-10-CM | POA: Diagnosis not present

## 2017-01-28 DIAGNOSIS — N952 Postmenopausal atrophic vaginitis: Secondary | ICD-10-CM | POA: Diagnosis not present

## 2017-01-28 DIAGNOSIS — N950 Postmenopausal bleeding: Secondary | ICD-10-CM | POA: Diagnosis not present

## 2017-02-03 DIAGNOSIS — R05 Cough: Secondary | ICD-10-CM | POA: Diagnosis not present

## 2017-04-06 HISTORY — PX: TOTAL ABDOMINAL HYSTERECTOMY: SHX209

## 2017-04-21 DIAGNOSIS — E559 Vitamin D deficiency, unspecified: Secondary | ICD-10-CM | POA: Diagnosis not present

## 2017-04-21 DIAGNOSIS — R239 Unspecified skin changes: Secondary | ICD-10-CM | POA: Diagnosis not present

## 2017-04-21 DIAGNOSIS — E039 Hypothyroidism, unspecified: Secondary | ICD-10-CM | POA: Diagnosis not present

## 2017-04-21 DIAGNOSIS — I491 Atrial premature depolarization: Secondary | ICD-10-CM | POA: Diagnosis not present

## 2017-04-21 DIAGNOSIS — R5383 Other fatigue: Secondary | ICD-10-CM | POA: Diagnosis not present

## 2017-04-21 DIAGNOSIS — E785 Hyperlipidemia, unspecified: Secondary | ICD-10-CM | POA: Diagnosis not present

## 2017-05-11 DIAGNOSIS — N814 Uterovaginal prolapse, unspecified: Secondary | ICD-10-CM | POA: Diagnosis not present

## 2017-05-11 DIAGNOSIS — Z4689 Encounter for fitting and adjustment of other specified devices: Secondary | ICD-10-CM | POA: Diagnosis not present

## 2017-06-08 DIAGNOSIS — L570 Actinic keratosis: Secondary | ICD-10-CM | POA: Diagnosis not present

## 2017-06-08 DIAGNOSIS — L718 Other rosacea: Secondary | ICD-10-CM | POA: Diagnosis not present

## 2017-06-08 DIAGNOSIS — Z85828 Personal history of other malignant neoplasm of skin: Secondary | ICD-10-CM | POA: Diagnosis not present

## 2017-06-08 DIAGNOSIS — D485 Neoplasm of uncertain behavior of skin: Secondary | ICD-10-CM | POA: Diagnosis not present

## 2017-06-08 DIAGNOSIS — L2089 Other atopic dermatitis: Secondary | ICD-10-CM | POA: Diagnosis not present

## 2017-06-18 DIAGNOSIS — N8111 Cystocele, midline: Secondary | ICD-10-CM | POA: Diagnosis not present

## 2017-06-18 DIAGNOSIS — N814 Uterovaginal prolapse, unspecified: Secondary | ICD-10-CM | POA: Diagnosis not present

## 2017-06-18 DIAGNOSIS — R82998 Other abnormal findings in urine: Secondary | ICD-10-CM | POA: Diagnosis not present

## 2017-06-18 DIAGNOSIS — N952 Postmenopausal atrophic vaginitis: Secondary | ICD-10-CM | POA: Diagnosis not present

## 2017-08-11 DIAGNOSIS — N393 Stress incontinence (female) (male): Secondary | ICD-10-CM | POA: Diagnosis not present

## 2017-08-11 DIAGNOSIS — N812 Incomplete uterovaginal prolapse: Secondary | ICD-10-CM | POA: Diagnosis not present

## 2017-08-11 DIAGNOSIS — N8111 Cystocele, midline: Secondary | ICD-10-CM | POA: Diagnosis not present

## 2017-08-17 DIAGNOSIS — Z1231 Encounter for screening mammogram for malignant neoplasm of breast: Secondary | ICD-10-CM | POA: Diagnosis not present

## 2017-10-06 DIAGNOSIS — N393 Stress incontinence (female) (male): Secondary | ICD-10-CM | POA: Diagnosis not present

## 2017-10-06 DIAGNOSIS — N812 Incomplete uterovaginal prolapse: Secondary | ICD-10-CM | POA: Diagnosis not present

## 2017-10-14 DIAGNOSIS — N184 Chronic kidney disease, stage 4 (severe): Secondary | ICD-10-CM | POA: Diagnosis not present

## 2017-10-14 DIAGNOSIS — N814 Uterovaginal prolapse, unspecified: Secondary | ICD-10-CM | POA: Diagnosis not present

## 2017-10-14 DIAGNOSIS — N800 Endometriosis of uterus: Secondary | ICD-10-CM | POA: Diagnosis not present

## 2017-10-14 DIAGNOSIS — N393 Stress incontinence (female) (male): Secondary | ICD-10-CM | POA: Diagnosis not present

## 2017-10-14 DIAGNOSIS — M6208 Separation of muscle (nontraumatic), other site: Secondary | ICD-10-CM | POA: Diagnosis not present

## 2017-10-14 DIAGNOSIS — N812 Incomplete uterovaginal prolapse: Secondary | ICD-10-CM | POA: Diagnosis not present

## 2017-10-15 DIAGNOSIS — N393 Stress incontinence (female) (male): Secondary | ICD-10-CM | POA: Diagnosis not present

## 2017-10-15 DIAGNOSIS — N814 Uterovaginal prolapse, unspecified: Secondary | ICD-10-CM | POA: Diagnosis not present

## 2017-11-04 ENCOUNTER — Other Ambulatory Visit: Payer: Self-pay

## 2017-11-08 DIAGNOSIS — E870 Hyperosmolality and hypernatremia: Secondary | ICD-10-CM | POA: Diagnosis not present

## 2017-11-08 DIAGNOSIS — Z87440 Personal history of urinary (tract) infections: Secondary | ICD-10-CM | POA: Diagnosis not present

## 2017-11-08 DIAGNOSIS — N390 Urinary tract infection, site not specified: Secondary | ICD-10-CM | POA: Diagnosis not present

## 2017-11-08 DIAGNOSIS — E559 Vitamin D deficiency, unspecified: Secondary | ICD-10-CM | POA: Diagnosis not present

## 2017-11-08 DIAGNOSIS — E039 Hypothyroidism, unspecified: Secondary | ICD-10-CM | POA: Diagnosis not present

## 2017-11-08 DIAGNOSIS — I491 Atrial premature depolarization: Secondary | ICD-10-CM | POA: Diagnosis not present

## 2017-11-08 DIAGNOSIS — R5383 Other fatigue: Secondary | ICD-10-CM | POA: Diagnosis not present

## 2017-11-08 DIAGNOSIS — E785 Hyperlipidemia, unspecified: Secondary | ICD-10-CM | POA: Diagnosis not present

## 2017-11-09 DIAGNOSIS — E039 Hypothyroidism, unspecified: Secondary | ICD-10-CM | POA: Diagnosis not present

## 2017-12-01 DIAGNOSIS — R339 Retention of urine, unspecified: Secondary | ICD-10-CM | POA: Diagnosis not present

## 2017-12-22 DIAGNOSIS — N9489 Other specified conditions associated with female genital organs and menstrual cycle: Secondary | ICD-10-CM | POA: Diagnosis not present

## 2017-12-22 DIAGNOSIS — Z8742 Personal history of other diseases of the female genital tract: Secondary | ICD-10-CM | POA: Diagnosis not present

## 2018-01-07 DIAGNOSIS — Z23 Encounter for immunization: Secondary | ICD-10-CM | POA: Diagnosis not present

## 2018-01-24 DIAGNOSIS — J028 Acute pharyngitis due to other specified organisms: Secondary | ICD-10-CM | POA: Diagnosis not present

## 2018-02-15 DIAGNOSIS — K5900 Constipation, unspecified: Secondary | ICD-10-CM | POA: Diagnosis not present

## 2018-02-15 DIAGNOSIS — N9489 Other specified conditions associated with female genital organs and menstrual cycle: Secondary | ICD-10-CM | POA: Diagnosis not present

## 2018-02-15 DIAGNOSIS — R279 Unspecified lack of coordination: Secondary | ICD-10-CM | POA: Diagnosis not present

## 2018-02-22 ENCOUNTER — Other Ambulatory Visit: Payer: Self-pay

## 2018-03-23 DIAGNOSIS — F411 Generalized anxiety disorder: Secondary | ICD-10-CM | POA: Diagnosis not present

## 2018-03-23 DIAGNOSIS — K5902 Outlet dysfunction constipation: Secondary | ICD-10-CM | POA: Diagnosis not present

## 2018-03-23 DIAGNOSIS — R8290 Unspecified abnormal findings in urine: Secondary | ICD-10-CM | POA: Diagnosis not present

## 2018-05-03 DIAGNOSIS — E785 Hyperlipidemia, unspecified: Secondary | ICD-10-CM | POA: Diagnosis not present

## 2018-07-11 DIAGNOSIS — E039 Hypothyroidism, unspecified: Secondary | ICD-10-CM | POA: Diagnosis not present

## 2018-10-25 DIAGNOSIS — E039 Hypothyroidism, unspecified: Secondary | ICD-10-CM | POA: Diagnosis not present

## 2018-10-31 DIAGNOSIS — E039 Hypothyroidism, unspecified: Secondary | ICD-10-CM | POA: Diagnosis not present

## 2018-12-29 DIAGNOSIS — E039 Hypothyroidism, unspecified: Secondary | ICD-10-CM | POA: Diagnosis not present

## 2019-01-19 DIAGNOSIS — Z85828 Personal history of other malignant neoplasm of skin: Secondary | ICD-10-CM | POA: Diagnosis not present

## 2019-01-19 DIAGNOSIS — L308 Other specified dermatitis: Secondary | ICD-10-CM | POA: Diagnosis not present

## 2019-01-19 DIAGNOSIS — L718 Other rosacea: Secondary | ICD-10-CM | POA: Diagnosis not present

## 2019-02-06 DIAGNOSIS — Z23 Encounter for immunization: Secondary | ICD-10-CM | POA: Diagnosis not present

## 2019-02-27 NOTE — Progress Notes (Signed)
Patient Care Team: Ernestene Kiel, MD as PCP - General (Internal Medicine)   HPI  Wanda Freeman is a 68 y.o. female Seen in 2017 for symptomatic PACs for which we undertook beta blocker trials 7/17. Drug doses were targeted low because of a history of hypertension  Recurrent symptoms 11/20 >> ALIVECOR-- Afib Personally reviewed   She is having symptoms of palpitations accompanied by dyspnea with modest exertion, i.e. climbing stairs and activities of daily living.  Also has episodes of abrupt but very brief lightheadedness which she thinks are followed by palpitations.  The episode that prompted the monitoring was a more profound episode of lightheadedness where she found herself having to follow-up today side.  Her husband, physician, presumably did not think that this was at all stroke DATE TEST EF   7/17 Echo  55-65% %         Thromboembolic risk factors ( age -2, Gender-1) for a CHADSVASc Score of 2   Records and Results Reviewed 7/17 echocardiogram normal 7/17 stress test data personally reviewed target heart rate achieved at 5 minutes and 28 seconds and the test terminated. Blood pressure systolic [id number]. Baseline ST segments without significant change  Past Medical History:  Diagnosis Date  . Thyroid disease   . Vitamin D deficiency     Past Surgical History:  Procedure Laterality Date  . stab phlebectomy Left 05/25/2016   > 20 incisions by Tinnie Gens MD  . stab phlebectomy Right 06/08/2016   stab phlebectomy > 20 incisions right leg by Tinnie Gens MD   . TOTAL ABDOMINAL HYSTERECTOMY  2019    Current Outpatient Medications  Medication Sig Dispense Refill  . ARMOUR THYROID 60 MG tablet Take 60 mg by mouth daily.    Marland Kitchen levothyroxine (SYNTHROID) 50 MCG tablet Take 50 mcg by mouth daily before breakfast.     . metoprolol succinate (TOPROL-XL) 25 MG 24 hr tablet Take 1 tablet (25 mg total) by mouth daily. (Patient taking differently: Take 25 mg by  mouth 3 times/day as needed-between meals & bedtime. ) 90 tablet 3  . Vitamin D, Ergocalciferol, (DRISDOL) 50000 units CAPS capsule Take 50,000 Units by mouth every 7 (seven) days.     No current facility-administered medications for this visit.     Allergies  Allergen Reactions  . Tizanidine Other (See Comments) and Swelling      Review of Systems negative except from HPI and PMH  Physical Exam BP 132/62 (BP Location: Left Arm, Patient Position: Sitting, Cuff Size: Normal)   Pulse 79   Ht 5\' 7"  (1.702 m)   Wt 184 lb (83.5 kg)   BMI 28.82 kg/m  Well developed and nourished in no acute distress HENT normal Neck supple with JVP-  flat   Clear Irregular rate and rhythm, no murmurs or gallops Abd-soft with active BS No Clubbing cyanosis edema Skin-warm and dry A & Oriented  Grossly normal sensory and motor function  ECG sinus rhythm with PACs in a pattern of trigeminy  Assessment and  Plan PACs  Atrial fibrillation paroxysmal  Dyspnea on exertion  Lightheadedness-presyncope   Frequent atrial ectopy is associated with the dyspnea as well as the lightheadedness.  Suspect it may be exertional related increase in atrial ectopy density or short runs of atrial fibrillation.  We will use a Zio patch.  Lightheadedness can be associated with the onset with decreased venous return and activation of neurally mediated reflexes or with termination and a pause.  Monitoring will help with this also.  Her symptoms are quite significant over the last 2 weeks but only over the last 2 weeks.  We discussed antiarrhythmic therapy and the potential proarrhythmic risks as well as benefits.  At this point she will hold off and reviewed this with her husband.  CHA2DS2-VASc score is 2; however, based on the ASSERT 2 trial at this point the brief duration of her episodes do not justify anticoagulation.  Monitoring will help [place] identify the duration of atrial fibrillation   Given the abrupt onset  of her symptoms 2 weeks ago, it begs a question as to triggers.  While she has no risk factors for pulmonary embolism, a D-dimer as an outpatient is a very powerful negative predictor.  Moreover, she has a thyroid condition which I do not understand, her TSH says she is hyperthyroid;    her T3-T4 state  she is hypothyroid and she feels like she is hypothyroid.  She is seeing endocrinology.        Current medicines are reviewed at length with the patient today .  The patient does not  have concerns regarding medicines.

## 2019-02-27 NOTE — H&P (View-Only) (Signed)
Patient Care Team: Ernestene Kiel, MD as PCP - General (Internal Medicine)   HPI  Wanda Freeman is a 68 y.o. female Seen in 2017 for symptomatic PACs for which we undertook beta blocker trials 7/17. Drug doses were targeted low because of a history of hypertension  Recurrent symptoms 11/20 >> ALIVECOR-- Afib Personally reviewed   She is having symptoms of palpitations accompanied by dyspnea with modest exertion, i.e. climbing stairs and activities of daily living.  Also has episodes of abrupt but very brief lightheadedness which she thinks are followed by palpitations.  The episode that prompted the monitoring was a more profound episode of lightheadedness where she found herself having to follow-up today side.  Her husband, physician, presumably did not think that this was at all stroke DATE TEST EF   7/17 Echo  55-65% %         Thromboembolic risk factors ( age -73, Gender-1) for a CHADSVASc Score of 2   Records and Results Reviewed 7/17 echocardiogram normal 7/17 stress test data personally reviewed target heart rate achieved at 5 minutes and 28 seconds and the test terminated. Blood pressure systolic [id number]. Baseline ST segments without significant change  Past Medical History:  Diagnosis Date  . Thyroid disease   . Vitamin D deficiency     Past Surgical History:  Procedure Laterality Date  . stab phlebectomy Left 05/25/2016   > 20 incisions by Tinnie Gens MD  . stab phlebectomy Right 06/08/2016   stab phlebectomy > 20 incisions right leg by Tinnie Gens MD   . TOTAL ABDOMINAL HYSTERECTOMY  2019    Current Outpatient Medications  Medication Sig Dispense Refill  . ARMOUR THYROID 60 MG tablet Take 60 mg by mouth daily.    Marland Kitchen levothyroxine (SYNTHROID) 50 MCG tablet Take 50 mcg by mouth daily before breakfast.     . metoprolol succinate (TOPROL-XL) 25 MG 24 hr tablet Take 1 tablet (25 mg total) by mouth daily. (Patient taking differently: Take 25 mg by  mouth 3 times/day as needed-between meals & bedtime. ) 90 tablet 3  . Vitamin D, Ergocalciferol, (DRISDOL) 50000 units CAPS capsule Take 50,000 Units by mouth every 7 (seven) days.     No current facility-administered medications for this visit.     Allergies  Allergen Reactions  . Tizanidine Other (See Comments) and Swelling      Review of Systems negative except from HPI and PMH  Physical Exam BP 132/62 (BP Location: Left Arm, Patient Position: Sitting, Cuff Size: Normal)   Pulse 79   Ht 5\' 7"  (1.702 m)   Wt 184 lb (83.5 kg)   BMI 28.82 kg/m  Well developed and nourished in no acute distress HENT normal Neck supple with JVP-  flat   Clear Irregular rate and rhythm, no murmurs or gallops Abd-soft with active BS No Clubbing cyanosis edema Skin-warm and dry A & Oriented  Grossly normal sensory and motor function  ECG sinus rhythm with PACs in a pattern of trigeminy  Assessment and  Plan PACs  Atrial fibrillation paroxysmal  Dyspnea on exertion  Lightheadedness-presyncope   Frequent atrial ectopy is associated with the dyspnea as well as the lightheadedness.  Suspect it may be exertional related increase in atrial ectopy density or short runs of atrial fibrillation.  We will use a Zio patch.  Lightheadedness can be associated with the onset with decreased venous return and activation of neurally mediated reflexes or with termination and a pause.  Monitoring will help with this also.  Her symptoms are quite significant over the last 2 weeks but only over the last 2 weeks.  We discussed antiarrhythmic therapy and the potential proarrhythmic risks as well as benefits.  At this point she will hold off and reviewed this with her husband.  CHA2DS2-VASc score is 2; however, based on the ASSERT 2 trial at this point the brief duration of her episodes do not justify anticoagulation.  Monitoring will help [place] identify the duration of atrial fibrillation   Given the abrupt onset  of her symptoms 2 weeks ago, it begs a question as to triggers.  While she has no risk factors for pulmonary embolism, a D-dimer as an outpatient is a very powerful negative predictor.  Moreover, she has a thyroid condition which I do not understand, her TSH says she is hyperthyroid;    her T3-T4 state  she is hypothyroid and she feels like she is hypothyroid.  She is seeing endocrinology.        Current medicines are reviewed at length with the patient today .  The patient does not  have concerns regarding medicines.

## 2019-02-28 ENCOUNTER — Encounter: Payer: Self-pay | Admitting: Internal Medicine

## 2019-02-28 ENCOUNTER — Other Ambulatory Visit
Admission: RE | Admit: 2019-02-28 | Discharge: 2019-02-28 | Disposition: A | Discharge status: Home / Self Care | Payer: Medicare Other | Source: Ambulatory Visit | Attending: Internal Medicine | Admitting: Internal Medicine

## 2019-02-28 ENCOUNTER — Ambulatory Visit (INDEPENDENT_AMBULATORY_CARE_PROVIDER_SITE_OTHER): Payer: Medicare Other | Admitting: Internal Medicine

## 2019-02-28 ENCOUNTER — Other Ambulatory Visit: Payer: Self-pay

## 2019-02-28 ENCOUNTER — Ambulatory Visit (INDEPENDENT_AMBULATORY_CARE_PROVIDER_SITE_OTHER): Payer: Medicare Other

## 2019-02-28 VITALS — BP 132/62 | HR 79 | Ht 67.0 in | Wt 184.0 lb

## 2019-02-28 DIAGNOSIS — I480 Paroxysmal atrial fibrillation: Secondary | ICD-10-CM | POA: Diagnosis not present

## 2019-02-28 DIAGNOSIS — R079 Chest pain, unspecified: Secondary | ICD-10-CM | POA: Diagnosis not present

## 2019-02-28 DIAGNOSIS — I491 Atrial premature depolarization: Secondary | ICD-10-CM | POA: Diagnosis not present

## 2019-02-28 LAB — FIBRIN DERIVATIVES D-DIMER (ARMC ONLY): Fibrin derivatives D-dimer (ARMC): 378.65 ng/mL (FEU) (ref 0.00–499.00)

## 2019-02-28 NOTE — Patient Instructions (Addendum)
Medication Instructions:  - Your physician recommends that you continue on your current medications as directed. Please refer to the Current Medication list given to you today.  *If you need a refill on your cardiac medications before your next appointment, please call your pharmacy*  Lab Work: -Your physician recommends that you have lab work today: [place], 1st desk on the right (registation) to check in   If you have labs (blood work) drawn today and your tests are completely normal, you will receive your results only by: Marland Kitchen MyChart Message (if you have MyChart) OR . A paper copy in the mail If you have any lab test that is abnormal or we need to change your treatment, we will call you to review the results.  Testing/Procedures: - Your physician has requested that you have an echocardiogram in the [place] office. Echocardiography is a painless test that uses sound waves to create images of your heart. It provides your doctor with information about the size and shape of your heart and how well your heart's chambers and valves are working. This procedure takes approximately one hour. There are no restrictions for this procedure.  - Your physician has recommended that you wear a Zio monitor (4 weeks)- the 1st will be placed today in the office & the 2nd one will be mailed to you. This monitor is a medical device that records the heart's electrical activity. Doctors most often use these monitors to diagnose arrhythmias. Arrhythmias are problems with the speed or rhythm of the heartbeat. The monitor is a small device applied to your chest. You can wear one while you do your normal daily activities. While wearing this monitor if you have any symptoms to push the button and record what you felt. Once you have worn this monitor for the period of time provider prescribed (Usually 14 days), you will return the monitor device in the postage paid box. Once it is returned they will download  the data collected and provide [place] with a report which the provider will then review and we will call you with those results. Important tips:  1. Avoid showering during the first 24 hours of wearing the monitor. 2. Avoid excessive sweating to help maximize wear time. 3. Do not submerge the device, no hot tubs, and no swimming pools. 4. Keep any lotions or oils away from the patch. 5. After 24 hours you may shower with the patch on. Take brief showers with your back facing the shower head.  6. Do not remove patch once it has been placed because that will interrupt data and decrease adhesive wear time. 7. Push the button when you have any symptoms and write down what you were feeling. 8. Once you have completed wearing your monitor, remove and place into box which has postage paid and place in your outgoing mailbox.  9. If for some reason you have misplaced your box then call our office and we can provide another box and/or mail it off for you.        Follow-Up: At [facility], you and your health needs are our priority.  As part of our continuing mission to provide you with exceptional heart care, we have created designated Provider Care Teams.  These Care Teams include your primary Cardiologist (physician) and Advanced Practice Providers (APPs -  Physician Assistants and Nurse Practitioners) who all work together to provide you with the care you need, when you need it.  Your next appointment:   6-8 week(s)  The format for your next appointment:   Virtual Visit   Provider:   Virl Axe, MD  Other Instructions - n/a   Echocardiogram An echocardiogram is a procedure that uses painless sound waves (ultrasound) to produce an image of the heart. Images from an echocardiogram can provide important information about:  Signs of coronary artery disease (CAD).  Aneurysm detection. An aneurysm is a weak or damaged part of an artery wall that bulges out from the normal force of blood  pumping through the body.  Heart size and shape. Changes in the size or shape of the heart can be associated with certain conditions, including heart failure, aneurysm, and CAD.  Heart muscle function.  Heart valve function.  Signs of a past heart attack.  Fluid buildup around the heart.  Thickening of the heart muscle.  A tumor or infectious growth around the heart valves. Tell a health care provider about:  Any allergies you have.  All medicines you are taking, including vitamins, herbs, eye drops, creams, and over-the-counter medicines.  Any blood disorders you have.  Any surgeries you have had.  Any medical conditions you have.  Whether you are pregnant or may be pregnant. What are the risks? Generally, this is a safe procedure. However, problems may occur, including:  Allergic reaction to dye (contrast) that may be used during the procedure. What happens before the procedure? No specific preparation is needed. You may eat and drink normally. What happens during the procedure?   An IV tube may be inserted into one of your veins.  You may receive contrast through this tube. A contrast is an injection that improves the quality of the pictures from your heart.  A gel will be applied to your chest.  A wand-like tool (transducer) will be moved over your chest. The gel will help to transmit the sound waves from the transducer.  The sound waves will harmlessly bounce off of your heart to allow the heart images to be captured in real-time motion. The images will be recorded on a computer. The procedure may vary among health care providers and hospitals. What happens after the procedure?  You may return to your normal, everyday life, including diet, activities, and medicines, unless your health care provider tells you not to do that. Summary  An echocardiogram is a procedure that uses painless sound waves (ultrasound) to produce an image of the heart.  Images from an  echocardiogram can provide important information about the size and shape of your heart, heart muscle function, heart valve function, and fluid buildup around your heart.  You do not need to do anything to prepare before this procedure. You may eat and drink normally.  After the echocardiogram is completed, you may return to your normal, everyday life, unless your health care provider tells you not to do that. This information is not intended to replace advice given to you by your health care provider. Make sure you discuss any questions you have with your health care provider. Document Released: 03/20/2000 Document Revised: 07/14/2018 Document Reviewed: 04/25/2016 Elsevier Patient Education  2020 [employer].

## 2019-03-05 DIAGNOSIS — R072 Precordial pain: Secondary | ICD-10-CM | POA: Diagnosis not present

## 2019-03-05 DIAGNOSIS — Z03818 Encounter for observation for suspected exposure to other biological agents ruled out: Secondary | ICD-10-CM | POA: Diagnosis not present

## 2019-03-05 DIAGNOSIS — R079 Chest pain, unspecified: Secondary | ICD-10-CM | POA: Diagnosis not present

## 2019-03-06 ENCOUNTER — Other Ambulatory Visit (HOSPITAL_COMMUNITY): Payer: Medicare Other

## 2019-03-06 ENCOUNTER — Ambulatory Visit (HOSPITAL_COMMUNITY)
Admission: AD | Admit: 2019-03-06 | Discharge: 2019-03-06 | Disposition: A | Discharge status: Home / Self Care | Payer: Medicare Other | Source: Other Acute Inpatient Hospital | Attending: Cardiology | Admitting: Cardiology

## 2019-03-06 ENCOUNTER — Encounter (HOSPITAL_COMMUNITY)
Admission: AD | Disposition: A | Discharge status: Home / Self Care | Payer: Self-pay | Source: Other Acute Inpatient Hospital | Attending: Cardiology

## 2019-03-06 ENCOUNTER — Encounter (HOSPITAL_COMMUNITY): Payer: Self-pay

## 2019-03-06 DIAGNOSIS — E785 Hyperlipidemia, unspecified: Secondary | ICD-10-CM | POA: Insufficient documentation

## 2019-03-06 DIAGNOSIS — Z7982 Long term (current) use of aspirin: Secondary | ICD-10-CM | POA: Diagnosis not present

## 2019-03-06 DIAGNOSIS — I480 Paroxysmal atrial fibrillation: Secondary | ICD-10-CM | POA: Insufficient documentation

## 2019-03-06 DIAGNOSIS — Z79899 Other long term (current) drug therapy: Secondary | ICD-10-CM | POA: Diagnosis not present

## 2019-03-06 DIAGNOSIS — R0609 Other forms of dyspnea: Secondary | ICD-10-CM | POA: Diagnosis not present

## 2019-03-06 DIAGNOSIS — I200 Unstable angina: Secondary | ICD-10-CM | POA: Diagnosis not present

## 2019-03-06 DIAGNOSIS — R42 Dizziness and giddiness: Secondary | ICD-10-CM | POA: Diagnosis not present

## 2019-03-06 DIAGNOSIS — R0600 Dyspnea, unspecified: Secondary | ICD-10-CM | POA: Diagnosis not present

## 2019-03-06 DIAGNOSIS — R072 Precordial pain: Secondary | ICD-10-CM | POA: Diagnosis not present

## 2019-03-06 DIAGNOSIS — I10 Essential (primary) hypertension: Secondary | ICD-10-CM | POA: Insufficient documentation

## 2019-03-06 DIAGNOSIS — I491 Atrial premature depolarization: Secondary | ICD-10-CM | POA: Diagnosis not present

## 2019-03-06 DIAGNOSIS — R55 Syncope and collapse: Secondary | ICD-10-CM | POA: Diagnosis not present

## 2019-03-06 DIAGNOSIS — Z79890 Hormone replacement therapy (postmenopausal): Secondary | ICD-10-CM | POA: Diagnosis not present

## 2019-03-06 DIAGNOSIS — R079 Chest pain, unspecified: Secondary | ICD-10-CM

## 2019-03-06 DIAGNOSIS — E039 Hypothyroidism, unspecified: Secondary | ICD-10-CM | POA: Diagnosis not present

## 2019-03-06 DIAGNOSIS — Z03818 Encounter for observation for suspected exposure to other biological agents ruled out: Secondary | ICD-10-CM | POA: Diagnosis not present

## 2019-03-06 HISTORY — PX: LEFT HEART CATH AND CORONARY ANGIOGRAPHY: CATH118249

## 2019-03-06 SURGERY — LEFT HEART CATH AND CORONARY ANGIOGRAPHY
Anesthesia: LOCAL

## 2019-03-06 MED ORDER — HEPARIN (PORCINE) IN NACL 1000-0.9 UT/500ML-% IV SOLN
INTRAVENOUS | Status: AC
  Filled 2019-03-06: qty 1000

## 2019-03-06 MED ORDER — ATORVASTATIN CALCIUM 40 MG PO TABS: 40.0000 mg | ORAL_TABLET | Freq: Every day | ORAL | 1 refills | Status: DC

## 2019-03-06 MED ORDER — ASPIRIN 81 MG PO CHEW: 81.0000 mg | CHEWABLE_TABLET | ORAL | Status: DC

## 2019-03-06 MED ORDER — FENTANYL CITRATE (PF) 100 MCG/2ML IJ SOLN
INTRAMUSCULAR | Status: DC | PRN
  Administered 2019-03-06 (×2): 25 ug via INTRAVENOUS

## 2019-03-06 MED ORDER — SODIUM CHLORIDE 0.9 % WEIGHT BASED INFUSION: 1.0000 mL/kg/h | INTRAVENOUS | Status: DC

## 2019-03-06 MED ORDER — LABETALOL HCL 5 MG/ML IV SOLN: 10.0000 mg | INTRAVENOUS | Status: DC | PRN

## 2019-03-06 MED ORDER — HEPARIN SODIUM (PORCINE) 1000 UNIT/ML IJ SOLN
INTRAMUSCULAR | Status: AC
  Filled 2019-03-06: qty 1

## 2019-03-06 MED ORDER — HEPARIN (PORCINE) IN NACL 1000-0.9 UT/500ML-% IV SOLN
INTRAVENOUS | Status: DC | PRN
  Administered 2019-03-06 (×2): 500 mL

## 2019-03-06 MED ORDER — SODIUM CHLORIDE 0.9 % WEIGHT BASED INFUSION
3.0000 mL/kg/h | INTRAVENOUS | Status: AC
  Administered 2019-03-06: 3 mL/kg/h via INTRAVENOUS

## 2019-03-06 MED ORDER — MIDAZOLAM HCL 2 MG/2ML IJ SOLN
INTRAMUSCULAR | Status: AC
  Filled 2019-03-06: qty 2

## 2019-03-06 MED ORDER — SODIUM CHLORIDE 0.9 % IV SOLN: 250.0000 mL | INTRAVENOUS | Status: DC | PRN

## 2019-03-06 MED ORDER — LIDOCAINE HCL (PF) 1 % IJ SOLN
INTRAMUSCULAR | Status: AC
  Filled 2019-03-06: qty 30

## 2019-03-06 MED ORDER — SODIUM CHLORIDE 0.9% FLUSH: 3.0000 mL | INTRAVENOUS | Status: DC | PRN

## 2019-03-06 MED ORDER — FENTANYL CITRATE (PF) 100 MCG/2ML IJ SOLN
INTRAMUSCULAR | Status: AC
  Filled 2019-03-06: qty 2

## 2019-03-06 MED ORDER — METOPROLOL SUCCINATE ER 25 MG PO TB24: 25.0000 mg | ORAL_TABLET | Freq: Every day | ORAL | 3 refills | Status: DC

## 2019-03-06 MED ORDER — ONDANSETRON HCL 4 MG/2ML IJ SOLN: 4.0000 mg | Freq: Four times a day (QID) | INTRAMUSCULAR | Status: DC | PRN

## 2019-03-06 MED ORDER — HEPARIN SODIUM (PORCINE) 1000 UNIT/ML IJ SOLN
INTRAMUSCULAR | Status: DC | PRN
  Administered 2019-03-06: 4000 [IU] via INTRAVENOUS

## 2019-03-06 MED ORDER — VERAPAMIL HCL 2.5 MG/ML IV SOLN
INTRAVENOUS | Status: DC | PRN
  Administered 2019-03-06: 10 mL via INTRA_ARTERIAL

## 2019-03-06 MED ORDER — SODIUM CHLORIDE 0.9 % IV SOLN: INTRAVENOUS | Status: DC

## 2019-03-06 MED ORDER — ASPIRIN 81 MG PO CHEW: 81.0000 mg | CHEWABLE_TABLET | Freq: Every day | ORAL | Status: DC

## 2019-03-06 MED ORDER — SODIUM CHLORIDE 0.9% FLUSH: 3.0000 mL | Freq: Two times a day (BID) | INTRAVENOUS | Status: DC

## 2019-03-06 MED ORDER — IOHEXOL 350 MG/ML SOLN
INTRAVENOUS | Status: DC | PRN
  Administered 2019-03-06: 55 mL via INTRA_ARTERIAL

## 2019-03-06 MED ORDER — MIDAZOLAM HCL 2 MG/2ML IJ SOLN
INTRAMUSCULAR | Status: DC | PRN
  Administered 2019-03-06: 1 mg via INTRAVENOUS
  Administered 2019-03-06: 2 mg via INTRAVENOUS

## 2019-03-06 MED ORDER — DIAZEPAM 5 MG PO TABS: 5.0000 mg | ORAL_TABLET | Freq: Four times a day (QID) | ORAL | Status: DC | PRN

## 2019-03-06 MED ORDER — VERAPAMIL HCL 2.5 MG/ML IV SOLN
INTRAVENOUS | Status: AC
  Filled 2019-03-06: qty 2

## 2019-03-06 MED ORDER — LIDOCAINE HCL (PF) 1 % IJ SOLN
INTRAMUSCULAR | Status: DC | PRN
  Administered 2019-03-06: 2 mL

## 2019-03-06 MED ORDER — ACETAMINOPHEN 325 MG PO TABS: 650.0000 mg | ORAL_TABLET | ORAL | Status: DC | PRN

## 2019-03-06 MED ORDER — HYDRALAZINE HCL 20 MG/ML IJ SOLN: 10.0000 mg | INTRAMUSCULAR | Status: DC | PRN

## 2019-03-06 SURGICAL SUPPLY — 11 items
CATH INFINITI JR4 5F (CATHETERS) ×2 IMPLANT
CATH OPTITORQUE TIG 4.0 5F (CATHETERS) ×2 IMPLANT
DEVICE RAD TR BAND REGULAR (VASCULAR PRODUCTS) ×2 IMPLANT
GLIDESHEATH SLEND SS 6F .021 (SHEATH) ×2 IMPLANT
GUIDEWIRE INQWIRE 1.5J.035X260 (WIRE) ×1 IMPLANT
INQWIRE 1.5J .035X260CM (WIRE) ×2
KIT HEART LEFT (KITS) ×2 IMPLANT
PACK CARDIAC CATHETERIZATION (CUSTOM PROCEDURE TRAY) ×2 IMPLANT
SHEATH PROBE COVER 6X72 (BAG) ×2 IMPLANT
TRANSDUCER W/STOPCOCK (MISCELLANEOUS) ×2 IMPLANT
TUBING CIL FLEX 10 FLL-RA (TUBING) ×2 IMPLANT

## 2019-03-06 NOTE — Discharge Summary (Signed)
Discharge Summary    Patient ID: Wanda Freeman,  MRN: [id number], DOB/AGE: 08/07/1950 68 y.o.  Admit date: 03/06/2019 Discharge date: 03/07/2019  Primary Care Provider: Ernestene Kiel Primary Cardiologist: Berniece Salines, DO  Discharge Diagnoses    Active Problems:   Exertional dyspnea   Chest pain of uncertain etiology   Allergies Allergies  Allergen Reactions  . Tizanidine Other (See Comments) and Swelling    Diagnostic Studies/Procedures    Cardiac Cath: 03/06/19   Dist RCA lesion is 10% stenosed.  The left ventricular systolic function is normal.  LV end diastolic pressure is low.  The left ventricular ejection fraction is 55-65% by visual estimate.   No significant coronary obstructive disease with only minimal 10% plaque in the RCA in the region of the acute margin; normal-appearing LAD, ramus intermediate vessel, and left circumflex vessel.  Normal LV function without focal segmental wall motion abnormalities.  EF estimate is 60 to 65%.  LVEDP is 6 mmHg.  RECOMMENDATION: The patient is currently under evaluation with Dr. Caryl Comes is wearing a Zio patch monitor to document PAF or recurrent atrial arrhythmias.  A 2D echo Doppler study had been ordered but not yet done.  LVEDP is low.  Will hydrate post procedure.  Plan discharge later today with follow-up with Dr. Berniece Salines. _____________   History of Present Illness     Ms. Wanda Freeman is a 68 year old female with past medical history noted above.  She has been seen by Dr. Caryl Comes as an outpatient for symptomatic PACs.  She was placed on beta-blocker therapy.  Underwent treadmill stress test in July 2017 which was normal.  Had an echocardiogram around that same time which showed an EF of 55 to 60%.  Had recurrent symptoms and ALIVECOR was reviewed noting atrial fibrillation.  She was last seen in the office on 02/28/2019 with Dr. Caryl Comes.  He noted frequent atrial ectopy that was associated with dyspnea and  lightheadedness.  Also noted her short runs of atrial fibrillation.  Plan was to place for Zio patch for further monitoring.  CHA2DS2-VASc score was noted at 2 but decision was made given these brief durations of episodes would defer anticoagulation at this point.  She presented to [facility] on 03/05/2019 for chest pain.  Reported she had been experiencing intermittent chest pain since the day prior.  Reported episode the day prior to admission started while she was washing dishes and had another episode later that afternoon while sitting on the couch watching TV.  States episodes lasted about 5 to 10 minutes prior to resolution.   In the ED at [facility] her EKG showed sinus rhythm with no acute ST/T wave abnormalities.  Her labs showed stable electrolytes, creatinine 0.06, WBC 5.6, hemoglobin 13.7.  Covid negative.  Chest x-ray negative.  She was evaluated by Dr. Harriet Masson and referred to [facility] for further evaluation with cardiac cath.  She was started on therapeutic Lovenox prior to transfer.  She medications prior to transfer include Imdur 30 mg daily, aspirin 81 mg daily, metoprolol 25 mg daily and Lipitor 40 mg daily.   Hospital Course     She was transferred to [facility] and underwent cardiac cath noted above with Dr. Claiborne Billings. 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 Normal EF via LV gram. She was continued on ASA, statin and BB therapy. Radial cath site stable. She was sent to short stay post cath and discharged home after post procedure IV fluids. Will  continue with Zio patch and follow up with Dr. Caryl Comes.    Wanda Freeman was seen by Dr. Claiborne Billings and determined stable for discharge home. Follow up in the office has been arranged. Medications are listed below.   _____________  Discharge Vitals Blood pressure (!) 111/54, pulse 69, resp. rate (!) 8, SpO2 97 %.  There were no vitals filed for this visit.  Labs & Radiologic Studies    CBC No results for input(s):  WBC, NEUTROABS, HGB, HCT, MCV, PLT in the last 72 hours. Basic Metabolic Panel No results for input(s): NA, K, CL, CO2, GLUCOSE, BUN, CREATININE, CALCIUM, MG, PHOS in the last 72 hours. Liver Function Tests No results for input(s): AST, ALT, ALKPHOS, BILITOT, PROT, ALBUMIN in the last 72 hours. No results for input(s): LIPASE, AMYLASE in the last 72 hours. Cardiac Enzymes No results for input(s): CKTOTAL, CKMB, CKMBINDEX, TROPONINI in the last 72 hours. BNP Invalid input(s): POCBNP D-Dimer No results for input(s): DDIMER in the last 72 hours. Hemoglobin A1C No results for input(s): HGBA1C in the last 72 hours. Fasting Lipid Panel No results for input(s): CHOL, HDL, LDLCALC, TRIG, CHOLHDL, LDLDIRECT in the last 72 hours. Thyroid Function Tests No results for input(s): TSH, T4TOTAL, T3FREE, THYROIDAB in the last 72 hours.  Invalid input(s): FREET3 _____________  No results found. Disposition   Pt is being discharged home today in good condition.  Follow-up Plans & Appointments    Follow-up Information    Deboraha Sprang, MD Follow up on 04/13/2019.   Specialty: Cardiology Why: at 11am your follow up appt.  Contact information: [id number] [place] [address] [phone]          Discharge Instructions    Call MD for:  redness, tenderness, or signs of infection (pain, swelling, redness, odor or green/yellow discharge around incision site)   Complete by: As directed    Diet - low sodium heart healthy   Complete by: As directed    Discharge instructions   Complete by: As directed    Radial Site Care Refer to this sheet in the next few weeks. These instructions provide you with information on caring for yourself after your procedure. Your caregiver may also give you more specific instructions. Your treatment has been planned according to current medical practices, but problems sometimes occur. Call your caregiver if you have any problems or questions  after your procedure. HOME CARE INSTRUCTIONS You may shower the day after the procedure.Remove the bandage (dressing) and gently wash the site with plain soap and water.Gently pat the site dry.  Do not apply powder or lotion to the site.  Do not submerge the affected site in water for 3 to 5 days.  Inspect the site at least twice daily.  Do not flex or bend the affected arm for 24 hours.  No lifting over 5 pounds (2.3 kg) for 5 days after your procedure.  Do not drive home if you are discharged the same day of the procedure. Have someone else drive you.  You may drive 24 hours after the procedure unless otherwise instructed by your caregiver.  What to expect: Any bruising will usually fade within 1 to 2 weeks.  Blood that collects in the tissue (hematoma) may be painful to the touch. It should usually decrease in size and tenderness within 1 to 2 weeks.  SEEK IMMEDIATE MEDICAL CARE IF: You have unusual pain at the radial site.  You have redness, warmth, swelling, or pain  at the radial site.  You have drainage (other than a small amount of blood on the dressing).  You have chills.  You have a fever or persistent symptoms for more than 72 hours.  You have a fever and your symptoms suddenly get worse.  Your arm becomes pale, cool, tingly, or numb.  You have heavy bleeding from the site. Hold pressure on the site.   Increase activity slowly   Complete by: As directed        Discharge Medications     Medication List    TAKE these medications   Armour Thyroid 60 MG tablet Generic drug: thyroid Take 60 mg by mouth daily.   aspirin 81 MG chewable tablet Chew 1 tablet (81 mg total) by mouth before cath procedure.   atorvastatin 40 MG tablet Commonly known as: Lipitor Take 1 tablet (40 mg total) by mouth daily.   levothyroxine 50 MCG tablet Commonly known as: SYNTHROID Take 50 mcg by mouth daily before breakfast.   metoprolol succinate 25 MG 24 hr tablet Commonly known as:  TOPROL-XL Take 1 tablet (25 mg total) by mouth daily. What changed:   when to take this  reasons to take this   Vitamin D (Ergocalciferol) 1.25 MG (50000 UT) Caps capsule Commonly known as: DRISDOL Take 50,000 Units by mouth every 7 (seven) days.        No                               Did the patient have a percutaneous coronary intervention (stent / angioplasty)?:  No.      Outstanding Labs/Studies   N/a   Duration of Discharge Encounter   Greater than 30 minutes including physician time.  Signed, Reino Bellis NP-C 03/07/2019, 1:44 PM

## 2019-03-06 NOTE — Interval H&P Note (Signed)
Cath Lab Visit (complete for each Cath Lab visit)  Clinical Evaluation Leading to the Procedure:   ACS: No.  Non-ACS:    Anginal Classification: CCS III  Anti-ischemic medical therapy: Minimal Therapy (1 class of medications)  Non-Invasive Test Results: No non-invasive testing performed  Prior CABG: No previous CABG      History and Physical Interval Note:  03/06/2019 3:42 PM  Leone Payor Wirthlin  has presented today for surgery, with the diagnosis of CAD.  The various methods of treatment have been discussed with the patient and family. After consideration of risks, benefits and other options for treatment, the patient has consented to  Procedure(s): LEFT HEART CATH AND CORONARY ANGIOGRAPHY (N/A) as a surgical intervention.  The patient's history has been reviewed, patient examined, no change in status, stable for surgery.  I have reviewed the patient's chart and labs.  Questions were answered to the patient's satisfaction.     Shelva Majestic

## 2019-03-06 NOTE — Discharge Instructions (Signed)
Radial Site Care  This sheet gives you information about how to care for yourself after your procedure. Your health care provider may also give you more specific instructions. If you have problems or questions, contact your health care provider. What can I expect after the procedure? After the procedure, it is common to have:  Bruising and tenderness at the catheter insertion area. Follow these instructions at home: Medicines  Take over-the-counter and prescription medicines only as told by your health care provider. Insertion site care  Follow instructions from your health care provider about how to take care of your insertion site. Make sure you: ? Wash your hands with soap and water before you change your bandage (dressing). If soap and water are not available, use hand sanitizer. ? Change your dressing as told by your health care provider. ? Leave stitches (sutures), skin glue, or adhesive strips in place. These skin closures may need to stay in place for 2 weeks or longer. If adhesive strip edges start to loosen and curl up, you may trim the loose edges. Do not remove adhesive strips completely unless your health care provider tells you to do that.  Check your insertion site every day for signs of infection. Check for: ? Redness, swelling, or pain. ? Fluid or blood. ? Pus or a bad smell. ? Warmth.  Do not take baths, swim, or use a hot tub until your health care provider approves.  You may shower 24-48 hours after the procedure, or as directed by your health care provider. ? Remove the dressing and gently wash the site with plain soap and water. ? Pat the area dry with a clean towel. ? Do not rub the site. That could cause bleeding.  Do not apply powder or lotion to the site. Activity   For 24 hours after the procedure, or as directed by your health care provider: ? Do not flex or bend the affected arm. ? Do not push or pull heavy objects with the affected arm. ? Do not  drive yourself home from the hospital or clinic. You may drive 24 hours after the procedure unless your health care provider tells you not to. ? Do not operate machinery or power tools.  Do not lift anything that is heavier than 10 lb (4.5 kg), or the limit that you are told, until your health care provider says that it is safe.  Ask your health care provider when it is okay to: ? Return to work or school. ? Resume usual physical activities or sports. ? Resume sexual activity. General instructions  If the catheter site starts to bleed, raise your arm and put firm pressure on the site. If the bleeding does not stop, get help right away. This is a medical emergency.  If you went home on the same day as your procedure, a responsible adult should be with you for the first 24 hours after you arrive home.  Keep all follow-up visits as told by your health care provider. This is important. Contact a health care provider if:  You have a fever.  You have redness, swelling, or yellow drainage around your insertion site. Get help right away if:  You have unusual pain at the radial site.  The catheter insertion area swells very fast.  The insertion area is bleeding, and the bleeding does not stop when you hold steady pressure on the area.  Your arm or hand becomes pale, cool, tingly, or numb. These symptoms may represent a serious problem   that is an emergency. Do not wait to see if the symptoms will go away. Get medical help right away. Call your local emergency services (911 in the [place] Do not drive yourself to the hospital. Summary  After the procedure, it is common to have bruising and tenderness at the site.  Follow instructions from your health care provider about how to take care of your radial site wound. Check the wound every day for signs of infection.  Do not lift anything that is heavier than 10 lb (4.5 kg), or the limit that you are told, until your health care provider says  that it is safe. This information is not intended to replace advice given to you by your health care provider. Make sure you discuss any questions you have with your health care provider. Document Released: 04/25/2010 Document Revised: 04/28/2017 Document Reviewed: 04/28/2017 Elsevier Patient Education  2020 Elsevier [employer]  Moderate Conscious Sedation, Adult, Care After These instructions provide you with information about caring for yourself after your procedure. Your health care provider may also give you more specific instructions. Your treatment has been planned according to current medical practices, but problems sometimes occur. Call your health care provider if you have any problems or questions after your procedure. What can I expect after the procedure? After your procedure, it is common:  To feel sleepy for several hours.  To feel clumsy and have poor balance for several hours.  To have poor judgment for several hours.  To vomit if you eat too soon. Follow these instructions at home: For at least 24 hours after the procedure:   Do not: ? Participate in activities where you could fall or become injured. ? Drive. ? Use heavy machinery. ? Drink alcohol. ? Take sleeping pills or medicines that cause drowsiness. ? Make important decisions or sign legal documents. ? Take care of children on your own.  Rest. Eating and drinking  Follow the diet recommended by your health care provider.  If you vomit: ? Drink water, juice, or soup when you can drink without vomiting. ? Make sure you have little or no nausea before eating solid foods. General instructions  Have a responsible adult stay with you until you are awake and alert.  Take over-the-counter and prescription medicines only as told by your health care provider.  If you smoke, do not smoke without supervision.  Keep all follow-up visits as told by your health care provider. This is important. Contact a health care  provider if:  You keep feeling nauseous or you keep vomiting.  You feel light-headed.  You develop a rash.  You have a fever. Get help right away if:  You have trouble breathing. This information is not intended to replace advice given to you by your health care provider. Make sure you discuss any questions you have with your health care provider. Document Released: 01/11/2013 Document Revised: 03/05/2017 Document Reviewed: 07/13/2015 Elsevier Patient Education  2020 Elsevier Inc.  

## 2019-03-06 NOTE — H&P (Addendum)
Cardiology Admission History and Physical:   Patient ID: Wanda Freeman MRN: [id number]; DOB: October 13, 1950   Admission date: 03/06/2019  Primary Care Provider: Ernestene Kiel, MD Primary Cardiologist: No primary care provider on file.  Primary Electrophysiologist:  None   Chief Complaint:  Chest pain  Patient Profile:   Wanda Freeman is a 68 y.o. female with past medical history of PACs and hypothyroidism who presented with chest pain to [facility].  History of Present Illness:   Wanda Freeman is a 68 year old female with past medical history noted above.  She has been seen by Dr. Caryl Comes as an outpatient for symptomatic PACs.  She was placed on beta-blocker therapy.  Underwent treadmill stress test in July 2017 which was normal.  Had an echocardiogram around that same time which showed an EF of 55 to 60%.  Had recurrent symptoms and ALIVECOR was reviewed noting atrial fibrillation.  She was last seen in the office on 02/28/2019 with Dr. Caryl Comes.  He noted frequent atrial ectopy that was associated with dyspnea and lightheadedness.  Also noted her short runs of atrial fibrillation.  Plan was to place for Zio patch for further monitoring.  CHA2DS2-VASc score was noted at 2 but decision was made given these brief durations of her episodes would defer anticoagulation at this point.  She presented to [facility] on 03/05/2019 for chest pain.  Reported she had been experiencing intermittent chest pain since the day prior.  Reported episode the day prior to admission started while she was washing dishes and had another episode later that afternoon while sitting on the couch watching TV.  States episodes lasted about 5 to 10 minutes prior to resolution.   In the ED at [facility] her EKG showed sinus rhythm with no acute ST/T wave abnormalities.  Her labs showed stable electrolytes, creatinine 0.06, WBC 5.6, hemoglobin 13.7.  Covid negative.  Chest x-ray negative.  She was evaluated by  Dr. Harriet Masson and referred to [facility] for further evaluation with cardiac cath.  She was started on therapeutic Lovenox prior to transfer.  She medications prior to transfer include Imdur 30 mg daily, aspirin 81 mg daily, metoprolol 25 mg daily and Lipitor 40 mg daily.  Heart Pathway Score:     Past Medical History:  Diagnosis Date  . Thyroid disease   . Vitamin D deficiency     Past Surgical History:  Procedure Laterality Date  . stab phlebectomy Left 05/25/2016   > 20 incisions by Tinnie Gens MD  . stab phlebectomy Right 06/08/2016   stab phlebectomy > 20 incisions right leg by Tinnie Gens MD   . TOTAL ABDOMINAL HYSTERECTOMY  2019     Medications Prior to Admission: Prior to Admission medications   Medication Sig Start Date End Date Taking? Authorizing Provider  ARMOUR THYROID 60 MG tablet Take 60 mg by mouth daily. 01/19/19   [provider]  levothyroxine (SYNTHROID) 50 MCG tablet Take 50 mcg by mouth daily before breakfast.     [provider]  metoprolol succinate (TOPROL-XL) 25 MG 24 hr tablet Take 1 tablet (25 mg total) by mouth daily. Patient taking differently: Take 25 mg by mouth 3 times/day as needed-between meals & bedtime.  02/06/16   Deboraha Sprang, MD  Vitamin D, Ergocalciferol, (DRISDOL) 50000 units CAPS capsule Take 50,000 Units by mouth every 7 (seven) days.    [provider]     Allergies:    Allergies  Allergen Reactions  . Tizanidine Other (See  Comments) and Swelling    Social History:   Social History   Socioeconomic History  . Marital status: Married    Spouse name: Not on file  . Number of children: Not on file  . Years of education: Not on file  . Highest education level: Not on file  Occupational History  . Not on file  Social Needs  . Financial resource strain: Not on file  . Food insecurity    Worry: Not on file    Inability: Not on file  . Transportation needs    Medical: Not on file    Non-medical: Not on file   Tobacco Use  . Smoking status: Never Smoker  . Smokeless tobacco: Never Used  Substance and Sexual Activity  . Alcohol use: Not on file  . Drug use: No  . Sexual activity: Not on file  Lifestyle  . Physical activity    Days per week: Not on file    Minutes per session: Not on file  . Stress: Not on file  Relationships  . Social Herbalist on phone: Not on file    Gets together: Not on file    Attends religious service: Not on file    Active member of club or organization: Not on file    Attends meetings of clubs or organizations: Not on file    Relationship status: Not on file  . Intimate partner violence    Fear of current or ex partner: Not on file    Emotionally abused: Not on file    Physically abused: Not on file    Forced sexual activity: Not on file  Other Topics Concern  . Not on file  Social History Narrative  . Not on file    Family History:   The patient's Family history is unknown by patient.    ROS:  Please see the history of present illness.  All other ROS reviewed and negative.     Physical Exam/Data:   Vitals:   03/06/19 1614 03/06/19 1619 03/06/19 1624 03/06/19 1629  BP: 120/65 118/66 130/68 128/64  Pulse: 68 77 74 76  Resp: 13 20 10  (!) 8  SpO2: 99% 98% 99% (!) 0%   No intake or output data in the 24 hours ending 03/06/19 1634 Last 3 Weights 02/28/2019 09/14/2016 06/08/2016  Weight (lbs) 184 lb 185 lb 189 lb  Weight (kg) 83.462 kg 83.915 kg 85.73 kg     There is no height or weight on file to calculate BMI.  General:  Well nourished, well developed, in no acute distress HEENT: normal Lymph: no adenopathy Neck: no JVD Endocrine:  No thryomegaly Vascular: No carotid bruits; FA pulses 2+ bilaterally without bruits  Cardiac:  normal S1, S2; RRR; no murmur  Lungs:  clear to auscultation bilaterally, no wheezing, rhonchi or rales  Abd: soft, nontender, no hepatomegaly  Ext: no edema Musculoskeletal:  No deformities, BUE and BLE  strength normal and equal Skin: warm and dry  Neuro:  CNs 2-12 intact, no focal abnormalities noted Psych:  Normal affect    EKG:  The ECG that was done 03/06/19 was personally reviewed and demonstrates SR with no acute ST/T changes.   Relevant CV Studies:  TTE: 10/2015  Study Conclusions  - Left ventricle: The cavity size was normal. Wall thickness was   normal. Systolic function was normal. The estimated ejection   fraction was in the range of 55% to 60%. Wall motion was normal;  there were no regional wall motion abnormalities. Left   ventricular diastolic function parameters were normal.  Impressions:  - Normal study.  Laboratory Data:  High Sensitivity Troponin:  No results for input(s): TROPONINIHS in the last 720 hours.    ChemistryNo results for input(s): NA, K, CL, CO2, GLUCOSE, BUN, CREATININE, CALCIUM, GFRNONAA, GFRAA, ANIONGAP in the last 168 hours.  No results for input(s): PROT, ALBUMIN, AST, ALT, ALKPHOS, BILITOT in the last 168 hours. HematologyNo results for input(s): WBC, RBC, HGB, HCT, MCV, MCH, MCHC, RDW, PLT in the last 168 hours. BNPNo results for input(s): BNP, PROBNP in the last 168 hours.  DDimer No results for input(s): DDIMER in the last 168 hours.   Radiology/Studies:  No results found.  Assessment and Plan:   Wanda Freeman is a 68 y.o. female with past medical history of PACs and hypothyroidism who presented with chest pain to [facility].  1. Chest pain: presented to [facility] after several episodes of chest pain prior to admission.  Evaluated by Dr. Harriet Masson and recommended transfer to [facility] for further evaluation with cardiac catheterization. Further recommendations post cath.   2. HL: LDL 113.  Placed on high-dose statin prior to transfer.  3. Hypothyroidism: continue on synthroid   Severity of Illness: The appropriate patient status for this patient is OBSERVATION. Observation status is judged to be reasonable and  necessary in order to provide the required intensity of service to ensure the patient's safety. The patient's presenting symptoms, physical exam findings, and initial radiographic and laboratory data in the context of their medical condition is felt to place them at decreased risk for further clinical deterioration. Furthermore, it is anticipated that the patient will be medically stable for discharge from the hospital within 2 midnights of admission. The following factors support the patient status of observation.   " The patient's presenting symptoms include chest pain. " The physical exam findings include stable exam. " The initial radiographic and laboratory data are stable.  For questions or updates, please contact [place] Please consult [email or web address] for contact info under    Signed, Reino Bellis, NP  03/06/2019 4:34 PM   Agree with evaluation as above.  Patient was referred by Dr. Harriet Masson for diagnostic cardiac catheterization.  I reviewed the records from [facility].  I discussed the procedure in detail with the patient including risk benefits of the procedure and potential for intervention.  She has agreed to undergo the procedure.

## 2019-03-07 ENCOUNTER — Encounter (HOSPITAL_COMMUNITY): Payer: Self-pay | Admitting: Cardiovascular Disease

## 2019-03-08 DIAGNOSIS — E039 Hypothyroidism, unspecified: Secondary | ICD-10-CM | POA: Diagnosis not present

## 2019-03-14 DIAGNOSIS — I480 Paroxysmal atrial fibrillation: Secondary | ICD-10-CM

## 2019-03-15 ENCOUNTER — Ambulatory Visit (HOSPITAL_COMMUNITY): Payer: Medicare Other | Attending: Internal Medicine

## 2019-03-15 ENCOUNTER — Other Ambulatory Visit: Payer: Self-pay

## 2019-03-15 DIAGNOSIS — I480 Paroxysmal atrial fibrillation: Secondary | ICD-10-CM | POA: Diagnosis not present

## 2019-03-16 ENCOUNTER — Other Ambulatory Visit (HOSPITAL_COMMUNITY): Payer: PRIVATE HEALTH INSURANCE

## 2019-03-17 ENCOUNTER — Ambulatory Visit: Payer: Medicare Other

## 2019-03-17 DIAGNOSIS — I480 Paroxysmal atrial fibrillation: Secondary | ICD-10-CM

## 2019-03-23 DIAGNOSIS — I480 Paroxysmal atrial fibrillation: Secondary | ICD-10-CM | POA: Diagnosis not present

## 2019-04-13 ENCOUNTER — Other Ambulatory Visit: Payer: Self-pay

## 2019-04-13 ENCOUNTER — Encounter: Payer: Self-pay | Admitting: Internal Medicine

## 2019-04-13 ENCOUNTER — Telehealth (INDEPENDENT_AMBULATORY_CARE_PROVIDER_SITE_OTHER): Payer: Medicare Other | Admitting: Internal Medicine

## 2019-04-13 VITALS — BP 118/65 | HR 59 | Ht 67.0 in | Wt 184.0 lb

## 2019-04-13 DIAGNOSIS — I491 Atrial premature depolarization: Secondary | ICD-10-CM | POA: Diagnosis not present

## 2019-04-13 DIAGNOSIS — I480 Paroxysmal atrial fibrillation: Secondary | ICD-10-CM | POA: Diagnosis not present

## 2019-04-13 MED ORDER — BISOPROLOL FUMARATE 5 MG PO TABS: 2.5000 mg | ORAL_TABLET | Freq: Every day | ORAL | 6 refills | Status: DC

## 2019-04-13 NOTE — Patient Instructions (Signed)
Medication Instructions:  Your physician has recommended you make the following change in your medication: You have been given Bisoprolol 5mg  tablet, you need to take 1/2 (2.5mg ) daily.   *If you need a refill on your cardiac medications before your next appointment, please call your pharmacy*  Lab Work: None ordered If you have labs (blood work) drawn today and your tests are completely normal, you will receive your results only by: Marland Kitchen MyChart Message (if you have MyChart) OR . A paper copy in the mail If you have any lab test that is abnormal or we need to change your treatment, we will call you to review the results.  Testing/Procedures: None ordered  Follow-Up: At [facility], you and your health needs are our priority.  As part of our continuing mission to provide you with exceptional heart care, we have created designated Provider Care Teams.  These Care Teams include your primary Cardiologist (physician) and Advanced Practice Providers (APPs -  Physician Assistants and Nurse Practitioners) who all work together to provide you with the care you need, when you need it.  Your next appointment:   6 week(s)  The format for your next appointment:   Virtual Visit   Provider:   Virl Axe, MD  Other Instructions NA

## 2019-04-13 NOTE — Progress Notes (Signed)
Electrophysiology TeleHealth Note   Due to national recommendations of social distancing due to COVID 19, an audio/video telehealth visit is felt to be most appropriate for this patient at this time.  See MyChart message from today for the patient's consent to telehealth for [facility].   Date:  04/13/2019   ID:  Wanda Freeman, DOB 1951-03-24, MRN [id number]  Location: patient's home  Provider location: [address] [place]  Evaluation Performed: Follow-up visit  PCP:  Ernestene Kiel, MD  Cardiologist:     Electrophysiologist:  SK   Chief Complaint:     History of Present Illness:    Wanda Freeman is a 69 y.o. female who presents via audio/video conferencing for a telehealth visit today.  Since last being seen in our clinic forpalpitations clarified as short acting Afib and episodes of LH  the patient reports feeling less atrial fibrillation but more problems with palpitations that correlate with PVCs.  Recurrent symptoms 11/20 >> ALIVECOR-- Afib Personally reviewed   She is having symptoms of palpitations accompanied by dyspnea with modest exertion, i.e. climbing stairs and activities of daily living.  Also has episodes of abrupt but very brief lightheadedness which she thinks are followed by palpitations.    DATE TEST EF   7/17 Echo  55-65% %   11/20 LHC 55-65% Cors w/o obstruction  12/220 Echo  60-65%      Because of chest pains in November associate with diaphoresis she was hospitalized and underwent catheterization as noted above without obstructive coronary disease.  Interval video monitoring demonstrated nonsustained supraventricular/atrial tachycardia is without sustained atrial fibrillation.  PVC count was scant, less than 1%.  However, she continued to have symptoms and she has used her apple watch to recordings of which she forwarded today that were associated with frequent PVCs i.e. 5-10% in the context of modest exertion and are  thereby limiting.  Also has PVCs that are problematic at night when she is trying to sleep.  Continues with chest pains that can persist for many many minutes without clear triggers.    Thromboembolic risk factors ( age -57, Gender-1) for a CHADSVASc Score of 2 The patient denies symptoms of fevers, chills, cough, or new SOB worrisome for COVID 19.    Past Medical History:  Diagnosis Date  . Thyroid disease   . Vitamin D deficiency     Past Surgical History:  Procedure Laterality Date  . LEFT HEART CATH AND CORONARY ANGIOGRAPHY N/A 03/06/2019   Procedure: LEFT HEART CATH AND CORONARY ANGIOGRAPHY;  Surgeon: Troy Sine, MD;  Location: [place] [facility];  Service: Cardiovascular;  Laterality: N/A;  . stab phlebectomy Left 05/25/2016   > 20 incisions by Tinnie Gens MD  . stab phlebectomy Right 06/08/2016   stab phlebectomy > 20 incisions right leg by Tinnie Gens MD   . TOTAL ABDOMINAL HYSTERECTOMY  2019    Current Outpatient Medications  Medication Sig Dispense Refill  . aspirin 81 MG chewable tablet Chew 1 tablet (81 mg total) by mouth before cath procedure. (Patient taking differently: Chew 81 mg by mouth daily. )    . atorvastatin (LIPITOR) 40 MG tablet Take 1 tablet (40 mg total) by mouth daily. 30 tablet 1  . metoprolol succinate (TOPROL-XL) 25 MG 24 hr tablet Take 1 tablet (25 mg total) by mouth daily. 90 tablet 3  . Vitamin D, Ergocalciferol, (DRISDOL) 50000 units CAPS capsule Take 50,000 Units by mouth every 7 (seven) days.    Marland Kitchen  SYNTHROID 100 MCG tablet Take 100 mcg by mouth daily.     No current facility-administered medications for this visit.    Allergies:   Tizanidine   Social History:  The patient  reports that she has never smoked. She has never used smokeless tobacco. She reports current alcohol use. She reports that she does not use drugs.   Family History:  The patient's   Family history is unknown by patient.   ROS:  Please see the history of present  illness.   All other systems are personally reviewed and negative.    Exam:    Vital Signs:  BP 118/65 (BP Location: Left Arm, Patient Position: Sitting, Cuff Size: Normal)   Pulse (!) 59   Ht 5\' 7"  (1.702 m)   Wt 184 lb (83.5 kg)   BMI 28.82 kg/m     Well appearing, alert and conversant, regular work of breathing,  good skin color Eyes- anicteric, neuro- grossly intact, skin- no apparent rash or lesions or cyanosis, mouth- oral mucosa is pink   Labs/Other Tests and Data Reviewed:    Recent Labs: No results found for requested labs within last 8760 hours.   Wt Readings from Last 3 Encounters:  04/13/19 184 lb (83.5 kg)  02/28/19 184 lb (83.5 kg)  09/14/16 185 lb (83.9 kg)     Other studies personally reviewed: Additional studies/ records that were reviewed today include: Strips from [place] demonstrate PVCs-  Prior radiographs      ASSESSMENT & PLAN:    Atrial fibrillation-nonsustained  Atrial tachycardia-nonsustained  PVCs  Sinus bradycardia  Chest pain   Wanda Freeman continues to struggle with palpitations not only in the be discombobulated but they are also been associated with exercise intolerance, triggered by exertion.  We have discussed treatment strategies including alternative beta-blockers and/or calcium blockers versus antiarrhythmic drugs.  We have elected to pursue the former.  We we will start her on bisoprolol 2.5 mg daily.  She will let us know over the next couple of weeks how well she is tolerating it and then how effective it is.  Next option might be nebivolol or propranolol.  Given the association with exertion I would use calcium blockers as second line.  Bradycardia limits up titration    COVID 19 screen The patient denies symptoms of COVID 19 at this time.  The importance of social distancing was discussed today.  Follow-up: 8 weeks telehealth    Current medicines are reviewed at length with the patient today.   The patient has  concerns regarding her medicines.  The following changes were made today: Begin bisoprolol and discontinue metoprolol  Labs/ tests ordered today include:   No orders of the defined types were placed in this encounter.   Future tests ( post COVID )    months  Patient Risk:  after full review of this patients clinical status, I feel that they are at moderate  risk at this time.  Today, I have spent 23 minutes with the patient with telehealth technology discussing the above.  Signed, Virl Axe, MD  04/13/2019 11:20 AM     [address] [place] [address] [phone] (office) [phone] (fax)

## 2019-04-14 ENCOUNTER — Telehealth: Payer: Self-pay | Admitting: Internal Medicine

## 2019-04-14 NOTE — Telephone Encounter (Signed)
lmov to schedule 6 week fu per checkout 04/13/19 Caryl Comes .  Virtual visit needed

## 2019-05-01 DIAGNOSIS — E039 Hypothyroidism, unspecified: Secondary | ICD-10-CM | POA: Diagnosis not present

## 2019-05-25 NOTE — Telephone Encounter (Signed)
Attempted to schedule.  LMOV to call office.  ° °

## 2019-06-14 DIAGNOSIS — R8290 Unspecified abnormal findings in urine: Secondary | ICD-10-CM | POA: Diagnosis not present

## 2019-06-14 DIAGNOSIS — N9489 Other specified conditions associated with female genital organs and menstrual cycle: Secondary | ICD-10-CM | POA: Diagnosis not present

## 2019-06-14 DIAGNOSIS — N952 Postmenopausal atrophic vaginitis: Secondary | ICD-10-CM | POA: Diagnosis not present

## 2019-06-16 ENCOUNTER — Telehealth: Payer: Self-pay | Admitting: Emergency Medicine

## 2019-06-16 NOTE — Telephone Encounter (Signed)
Patient aware appointment with Dr Caryl Comes is at 0800 am on 06/19/19 in [place] at [facility] office for loop implant. Patient instructed to arrive at [place] to check in .

## 2019-06-18 DIAGNOSIS — R002 Palpitations: Secondary | ICD-10-CM | POA: Insufficient documentation

## 2019-06-19 ENCOUNTER — Encounter: Payer: Self-pay | Admitting: Internal Medicine

## 2019-06-19 ENCOUNTER — Ambulatory Visit (INDEPENDENT_AMBULATORY_CARE_PROVIDER_SITE_OTHER): Payer: Medicare Other | Admitting: Internal Medicine

## 2019-06-19 ENCOUNTER — Other Ambulatory Visit: Payer: Self-pay

## 2019-06-19 DIAGNOSIS — R002 Palpitations: Secondary | ICD-10-CM | POA: Diagnosis not present

## 2019-06-19 NOTE — Progress Notes (Signed)
Patient Care Team: Ernestene Kiel, MD as PCP - General (Internal Medicine) Berniece Salines, DO as PCP - Cardiology (Cardiology)   HPI  Wanda Freeman is a 69 y.o. female Seen in 2017 for symptomatic PACs for which we undertook beta blocker trials 7/17. Drug doses were targeted low because of a history of hypertension  Seen today because of episodes of presyncope.  Both events occurred while sitting.  They were sudden in onset.  Duration about 5 seconds.  Some residual instability.  Visual loss.  There is a sense of a violent motion associated with these, one took her to the right, 1 took her to the left.  It is not clear to her that it was not just her response to the event itself.   She has had other presyncopal events associated with palpitations.  It is her impression that these episodes are distinct.   Is been associated with some sensation of bigeminy in the wake of the events.  Recurrent symptoms 11/20 >> ALIVECOR-- Afib Personally reviewed; the durations have been brief.  Hence she is not anticoagulated.    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  Recurrent problems with chest pain.  Prompting catheterization.  Chest pain has been appreciated to be associated with ventricular ectopy    . DATE TEST EF   7/17 Echo 55-65%%   11/20 LHC 55-65% Cors w/o obstruction  12/20 Echo  123456     Thromboembolic risk factors ( age -79, Gender-1) for a CHADSVASc Score of 2  Date Cr K TSH Hgb  1/21    0.523<<0.011     Treated hypothyroidism  Her Past Medical History:  Diagnosis Date  . Thyroid disease   . Vitamin D deficiency     Past Surgical History:  Procedure Laterality Date  . LEFT HEART CATH AND CORONARY ANGIOGRAPHY N/A 03/06/2019   Procedure: LEFT HEART CATH AND CORONARY ANGIOGRAPHY;  Surgeon: Troy Sine, MD;  Location: [place] [facility];  Service: Cardiovascular;  Laterality: N/A;  . stab phlebectomy Left 05/25/2016   > 20 incisions by Tinnie Gens MD  . stab  phlebectomy Right 06/08/2016   stab phlebectomy > 20 incisions right leg by Tinnie Gens MD   . TOTAL ABDOMINAL HYSTERECTOMY  2019    Current Outpatient Medications  Medication Sig Dispense Refill  . atorvastatin (LIPITOR) 40 MG tablet Take 1 tablet (40 mg total) by mouth daily. 30 tablet 1  . bisoprolol (ZEBETA) 5 MG tablet Take 0.5 tablets (2.5 mg total) by mouth daily. 30 tablet 6  . SYNTHROID 100 MCG tablet Take 100 mcg by mouth daily.    . Vitamin D, Ergocalciferol, (DRISDOL) 50000 units CAPS capsule Take 50,000 Units by mouth every 7 (seven) days.     No current facility-administered medications for this visit.    Allergies  Allergen Reactions  . Tizanidine Other (See Comments) and Swelling      Review of Systems negative except from HPI and PMH  Physical Exam BP 132/60   Pulse 72   Ht 5\' 7"  (1.702 m)   Wt 188 lb (85.3 kg)   SpO2 97%   BMI 29.44 kg/m  Well developed and nourished in no acute distress HENT normal Neck supple with JVP-  Flat  Clear Regular rate and rhythm, no murmurs or gallops Abd-soft with active BS No Clubbing cyanosis edema Skin-warm and dry A & Oriented  Grossly normal sensory and motor function  EC    Assessment and  Plan PAC/PVCs  Atrial fibrillation paroxysmal  Dyspnea on exertion  Lightheadedness-presyncope    With recurrent presyncope in the context of known PVC/ PACs concern is arrhythmic-- and have discussed the use of an implantable loop recorder to see if we can make an arrhythmic correlation  Wanda Freeman [id number]  [id number]  Pre op Dx syncope Post op Dx Same  Procedure  Loop Recorder implantation  After routine prep and drape of the left parasternal area, a small incision was created. A Medtronic LINQ Reveal Loop Recorder  Serial Number  [id number] was inserted.    SteriStrip dressing was  applied.  The patient tolerated the procedure without apparent complication.  EBL < 10cc     Virl Axe,  MD 06/19/2019 6:06 PM     Current medicines are reviewed at length with the patient today .  The patient does not  have concerns regarding medicines.

## 2019-06-19 NOTE — Patient Instructions (Addendum)
Medication Instructions:  Your physician recommends that you continue on your current medications as directed. Please refer to the Current Medication list given to you today.  Labwork: None ordered.  Testing/Procedures: None ordered.  Follow-Up: Your physician wants you to follow-up in: Virtual Wound Check 07/06/2019 at 9am.   Remote monitoring is used to monitor your Pacemaker of ICD from home. This monitoring reduces the number of office visits required to check your device to one time per year. It allows [place] to keep an eye on the functioning of your device to ensure it is working properly.   Any Other Special Instructions Will Be Listed Below (If Applicable).   Wound Care, Adult Taking care of your wound properly can help to prevent pain, infection, and scarring. It can also help your wound to heal more quickly. How to care for your wound Wound care      Follow instructions from your health care provider about how to take care of your wound. Make sure you: ? Wash your hands with soap and water before you change the bandage (dressing). If soap and water are not available, use hand sanitizer. ? Change your dressing as told by your health care provider. ? Leave stitches (sutures), skin glue, or adhesive strips in place. These skin closures may need to stay in place for 2 weeks or longer. If adhesive strip edges start to loosen and curl up, you may trim the loose edges. Do not remove adhesive strips completely unless your health care provider tells you to do that.  Check your wound area every day for signs of infection. Check for: ? Redness, swelling, or pain. ? Fluid or blood. ? Warmth. ? Pus or a bad smell.  Ask your health care provider if you should clean the wound with mild soap and water. Doing this may include: ? Using a clean towel to pat the wound dry after cleaning it. Do not rub or scrub the wound. ? Covering the incision with a clean dressing.  You may remove the large  dressing on ** 06/23/2019.  Ask your health care provider when you can leave the wound uncovered.  Keep the dressing dry until your health care provider says it can be removed. Do not take baths, swim, use a hot tub, or do anything that would put the wound underwater until your health care provider approves. Ask your health care provider if you can take showers. You may only be allowed to take sponge baths.  You may shower on ** Tuesday, 06/20/2019 Medicines            Take over-the-counter and prescription medicines only as told by your health care              provider.   Return to your normal activities as told by your health care provider. Ask your health care provider what activities are safe.  Do not scratch or pick at the wound.  Do not use any products that contain nicotine or tobacco, such as cigarettes and e-cigarettes. These may delay wound healing. If you need help quitting, ask your health care provider.  Keep all follow-up visits as told by your health care provider. This is important.  Eat a diet that includes protein, vitamin A, vitamin C, and other nutrient-rich foods to help the wound heal. ? Foods rich in protein include meat, dairy, beans, nuts, and other sources. ? Foods rich in vitamin A include carrots and dark green, leafy vegetables. ? Foods rich in vitamin  C include citrus, tomatoes, and other fruits and vegetables. ? Nutrient-rich foods have protein, carbohydrates, fat, vitamins, or minerals. Eat a variety of healthy foods including vegetables, fruits, and whole grains. Contact a health care provider if:  You received a tetanus shot and you have swelling, severe pain, redness, or bleeding at the injection site.  Your pain is not controlled with medicine.  You have redness, swelling, or pain around the wound.  You have fluid or blood coming from the wound.  Your wound feels warm to the touch.  You have pus or a bad smell coming from the wound.  You have a  fever or chills.  You are nauseous or you vomit.  You are dizzy. Get help right away if:  You have a red streak going away from your wound.  The edges of the wound open up and separate.  Your wound is bleeding, and the bleeding does not stop with gentle pressure.  You have a rash.  You faint.  You have trouble breathing. Summary  Always wash your hands with soap and water before changing your bandage (dressing).  To help with healing, eat foods that are rich in protein, vitamin A, vitamin C, and other nutrients.  Check your wound every day for signs of infection. Contact your health care provider if you suspect that your wound is infected. This information is not intended to replace advice given to you by your health care provider. Make sure you discuss any questions you have with your health care provider. Document Revised: 07/11/2018 Document Reviewed: 10/08/2015 Elsevier Patient Education  [employer].   If you need a refill on your cardiac medications before your next appointment, please call your pharmacy.

## 2019-07-06 ENCOUNTER — Other Ambulatory Visit: Payer: Self-pay

## 2019-07-06 ENCOUNTER — Telehealth (INDEPENDENT_AMBULATORY_CARE_PROVIDER_SITE_OTHER): Payer: Medicare Other | Admitting: *Deleted

## 2019-07-06 ENCOUNTER — Telehealth: Payer: Self-pay | Admitting: *Deleted

## 2019-07-06 DIAGNOSIS — R002 Palpitations: Secondary | ICD-10-CM

## 2019-07-06 DIAGNOSIS — Z95818 Presence of other cardiac implants and grafts: Secondary | ICD-10-CM

## 2019-07-06 NOTE — Progress Notes (Signed)
Patient verbally consented to ILR wound check via virtual visit due to COVID-19.  ILR wound check via virtual visit. Steri-strips removed by patient prior to visit. Incision edges appear fully approximated and well healed. No evidence of drainage, redness, or swelling. Patient denies fever or chills. Reviewed signs/symptoms of infection; patient aware to call if any concerns. Patient educated about Carelink app and symptom activator.  ILR transmissions up to date. No tachy, pause, brady, or AF episodes. Discussed 3 symptom episodes. Patient reports dizziness and near syncope, as well as palpitations. ECGs show SR/ST with PACs, PVCs, and short run of SVT. Patient aware of follow-up appointment with Dr. Caryl Comes on 07/13/19. ILR data exported to Dr. Caryl Comes for review in advance of appointment.

## 2019-07-06 NOTE — Telephone Encounter (Signed)
Video visit completed 07/06/19. See encounter notes for details.

## 2019-07-06 NOTE — Telephone Encounter (Signed)
LMOVM (DPR) requesting call back to DC to complete virtual ILR wound check. Direct number and office hours provided.

## 2019-07-07 LAB — CUP PACEART REMOTE DEVICE CHECK
Date Time Interrogation Session: 20210401092927
Implantable Pulse Generator Implant Date: 20210315

## 2019-07-13 ENCOUNTER — Other Ambulatory Visit: Payer: Self-pay

## 2019-07-13 ENCOUNTER — Ambulatory Visit (INDEPENDENT_AMBULATORY_CARE_PROVIDER_SITE_OTHER): Payer: Medicare Other | Admitting: Internal Medicine

## 2019-07-13 ENCOUNTER — Encounter: Payer: Self-pay | Admitting: Internal Medicine

## 2019-07-13 VITALS — BP 122/64 | HR 63 | Ht 67.0 in | Wt 186.2 lb

## 2019-07-13 DIAGNOSIS — R002 Palpitations: Secondary | ICD-10-CM

## 2019-07-13 DIAGNOSIS — I4891 Unspecified atrial fibrillation: Secondary | ICD-10-CM | POA: Insufficient documentation

## 2019-07-13 DIAGNOSIS — I480 Paroxysmal atrial fibrillation: Secondary | ICD-10-CM | POA: Diagnosis not present

## 2019-07-13 MED ORDER — ATORVASTATIN CALCIUM 40 MG PO TABS: 40.0000 mg | ORAL_TABLET | Freq: Every day | ORAL | 1 refills | Status: DC

## 2019-07-13 MED ORDER — FLECAINIDE ACETATE 50 MG PO TABS: 50.0000 mg | ORAL_TABLET | Freq: Two times a day (BID) | ORAL | 3 refills | Status: DC

## 2019-07-13 NOTE — Patient Instructions (Signed)
Medication Instructions:  Your physician has recommended you make the following change in your medication:   **Begin Flecainide 50mg  by mouth twice daily.  *If you need a refill on your cardiac medications before your next appointment, please call your pharmacy*   Lab Work: None ordered.  If you have labs (blood work) drawn today and your tests are completely normal, you will receive your results only by: Marland Kitchen MyChart Message (if you have MyChart) OR . A paper copy in the mail If you have any lab test that is abnormal or we need to change your treatment, we will call you to review the results.   Testing/Procedures: None ordered.    Follow-Up: At [facility], you and your health needs are our priority.  As part of our continuing mission to provide you with exceptional heart care, we have created designated Provider Care Teams.  These Care Teams include your primary Cardiologist (physician) and Advanced Practice Providers (APPs -  Physician Assistants and Nurse Practitioners) who all work together to provide you with the care you need, when you need it.  We recommend signing up for the patient portal called "MyChart".  Sign up information is provided on this After Visit Summary.  MyChart is used to connect with patients for Virtual Visits (Telemedicine).  Patients are able to view lab/test results, encounter notes, upcoming appointments, etc.  Non-urgent messages can be sent to your provider as well.   To learn more about what you can do with MyChart, go to [email or web address].    Your next appointment:   3 month(s)  The format for your next appointment:   In Person  Provider:   Virl Axe, MD

## 2019-07-13 NOTE — Progress Notes (Signed)
Patient Care Team: Ernestene Kiel, MD as PCP - General (Internal Medicine) Berniece Salines, DO as PCP - Cardiology (Cardiology)   HPI  Wanda Freeman is a 69 y.o. female Seen in 2017 for symptomatic PACs for which we undertook beta blocker trials 7/17. Drug doses were targeted low because of a history of hypotension. implanted loop recorder (DOI 3/2   Episodes of presyncope.  Both events occurred while sitting.  They were sudden in onset.  Duration about 5 seconds.  Some residual instability.  Visual loss.  There is a sense of a violent motion associated with these, one took her to the right, 1 took her to the left.  It is not clear to her that it was not just her response to the event itself.    Recurrent symptoms 11/20 >> ALIVECOR-- Afib Personally reviewed; the durations have been brief.  Hence she is not anticoagulated.    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  Recurrent problems with chest pain.  Prompting catheterization.  Chest pain has been appreciated to be associated with ventricular ectopy  Episode of presyncope.  Review of the Linq 2 symptomatic episodes, one associated with nonsustained atrial tachycardia and the other with frequent ventricular ectopy as well as atrial ectopy. \ She notes also multiple episodes in the day of lightheadedness which when she looks at her apple watch is associated with an abrupt change in heart rate that then comes back down.  Struggles with dyspnea on exertion and chest discomfort  Extremely anxious, feels like life has been significantly curtailed  Palpitations have been much improved, not withstanding, on her bisoprolol which she is self uptitrated from 2.5--5 mg.    . DATE TEST EF   7/17 Echo 55-65%%   11/20 LHC 55-65% Cors w/o obstruction  12/20 Echo  123456     Thromboembolic risk factors ( age -28, Gender-1) for a CHADSVASc Score of 2  Date Cr K TSH Hgb  1/21    0.523<<0.011     Treated hypothyroidism  Her Past Medical  History:  Diagnosis Date  . Thyroid disease   . Vitamin D deficiency     Past Surgical History:  Procedure Laterality Date  . LEFT HEART CATH AND CORONARY ANGIOGRAPHY N/A 03/06/2019   Procedure: LEFT HEART CATH AND CORONARY ANGIOGRAPHY;  Surgeon: Troy Sine, MD;  Location: [place] [facility];  Service: Cardiovascular;  Laterality: N/A;  . stab phlebectomy Left 05/25/2016   > 20 incisions by Tinnie Gens MD  . stab phlebectomy Right 06/08/2016   stab phlebectomy > 20 incisions right leg by Tinnie Gens MD   . TOTAL ABDOMINAL HYSTERECTOMY  2019    Current Outpatient Medications  Medication Sig Dispense Refill  . atorvastatin (LIPITOR) 40 MG tablet Take 1 tablet (40 mg total) by mouth daily. 90 tablet 1  . bisoprolol (ZEBETA) 5 MG tablet Take 0.5 tablets (2.5 mg total) by mouth daily. (Patient taking differently: Take 5 mg by mouth daily. ) 30 tablet 6  . SYNTHROID 100 MCG tablet Take 100 mcg by mouth daily.    . Vitamin D, Ergocalciferol, (DRISDOL) 50000 units CAPS capsule Take 50,000 Units by mouth every 7 (seven) days.    . flecainide (TAMBOCOR) 50 MG tablet Take 1 tablet (50 mg total) by mouth 2 (two) times daily. 180 tablet 3   No current facility-administered medications for this visit.    Allergies  Allergen Reactions  . Tizanidine Other (See Comments) and Swelling  Review of Systems negative except from HPI and PMH  Physical Exam BP 122/64 (BP Location: Left Arm, Patient Position: Sitting, Cuff Size: Normal)   Pulse 63   Ht 5\' 7"  (1.702 m)   Wt 186 lb 4 oz (84.5 kg)   SpO2 98%   BMI 29.17 kg/m  Well developed and nourished in no acute distress HENT normal Neck supple with JVP-  flat   Clear Regular rate and rhythm, no murmurs or gallops Abd-soft with active BS No Clubbing cyanosis edema Skin-warm and dry A & Oriented  Grossly normal sensory and motor function  ECG sinus at 63 Interval 03/12/1938  Assessment and  Plan PAC/PVCs  Atrial  fibrillation paroxysmal  Dyspnea on exertion  Lightheadedness-presyncope  Implantable loop recorder (DOI 3/21)  Loop recorder demonstrated nonsustained atrial tachycardia but more interestingly PVCs and PACs associated with her near syncopal event.  Her presyncope/multiple times a day dizziness events are associated with a change in her heart rate is detected by her apple watch.  I think it is a reasonable assumption at this point, particularly based on the improvement of her symptoms with the low-dose beta-blocker, that the ectopy is contributing to her symptoms of lightheadedness.  Previously we had seen chest pain and thought it correlated with her PVCs.  Hence, we will undertake 1C antiarrhythmic therapy for ectopy suppression.  We will start at 25 mg twice daily and then increase to 50 mg a day as needed and as tolerated.  We reviewed [place] driving restrictions.  I do not have in my notes that she had syncope.  She mentioned it and I failed to clarify it today.  Virl Axe, MD 07/13/2019 11:06 AM     Current medicines are reviewed at length with the patient today .  The patient does not  have concerns regarding medicines.

## 2019-07-21 ENCOUNTER — Ambulatory Visit (INDEPENDENT_AMBULATORY_CARE_PROVIDER_SITE_OTHER): Payer: Medicare Other | Admitting: *Deleted

## 2019-07-21 DIAGNOSIS — R002 Palpitations: Secondary | ICD-10-CM | POA: Diagnosis not present

## 2019-07-21 LAB — CUP PACEART REMOTE DEVICE CHECK
Date Time Interrogation Session: 20210415150549
Implantable Pulse Generator Implant Date: 20210315

## 2019-07-21 NOTE — Progress Notes (Signed)
ILR Remote 

## 2019-07-31 NOTE — Telephone Encounter (Signed)
Stpoke with pt and advised per Oda Kilts, PA pt should restart Bisoprolol 2.5mg  at bedtime d/t being on Flecainide.  Pt should be sure she is taking in enough fluids especially when having episodes of dizziness.  Pt requested to schedule appointment this week with Mr Chalmers Cater, pt states she does not have her calendar but she will send message re: what day she would like to schedule.  Pt verbalizes understanding and agrees with current plan.

## 2019-08-03 ENCOUNTER — Other Ambulatory Visit: Payer: Self-pay

## 2019-08-03 DIAGNOSIS — E7800 Pure hypercholesterolemia, unspecified: Secondary | ICD-10-CM

## 2019-08-03 DIAGNOSIS — Z79899 Other long term (current) drug therapy: Secondary | ICD-10-CM

## 2019-08-03 DIAGNOSIS — R002 Palpitations: Secondary | ICD-10-CM

## 2019-08-03 NOTE — Progress Notes (Signed)
CMET order placed per VO of Dr Caryl Comes.

## 2019-08-03 NOTE — Telephone Encounter (Signed)
Spoke with pt to ask if she still wanted to follow up with Rebecca Eaton.  Pt states Dr Caryl Comes has also responded to her message and she did not feel like Dr Caryl Comes felt she needed to be seen according to message.  Pt would still like to have labwork if Dr Caryl Comes is agreeable. As he did not address her request for labs.  Will forward request to Dr Caryl Comes to further address.

## 2019-08-04 NOTE — Addendum Note (Signed)
Addended by: Thora Lance on: 08/04/2019 09:22 PM   Modules accepted: Orders

## 2019-08-08 ENCOUNTER — Other Ambulatory Visit: Payer: Self-pay

## 2019-08-08 ENCOUNTER — Other Ambulatory Visit: Payer: Medicare Other

## 2019-08-08 DIAGNOSIS — R002 Palpitations: Secondary | ICD-10-CM

## 2019-08-08 DIAGNOSIS — Z79899 Other long term (current) drug therapy: Secondary | ICD-10-CM | POA: Diagnosis not present

## 2019-08-08 DIAGNOSIS — E7800 Pure hypercholesterolemia, unspecified: Secondary | ICD-10-CM

## 2019-08-08 LAB — COMPREHENSIVE METABOLIC PANEL
ALT: 14 IU/L (ref 0–32)
AST: 22 IU/L (ref 0–40)
Albumin/Globulin Ratio: 2 (ref 1.2–2.2)
Albumin: 4.5 g/dL (ref 3.8–4.8)
Alkaline Phosphatase: 57 IU/L (ref 39–117)
BUN/Creatinine Ratio: 18 (ref 12–28)
BUN: 14 mg/dL (ref 8–27)
Bilirubin Total: 0.4 mg/dL (ref 0.0–1.2)
CO2: 26 mmol/L (ref 20–29)
Calcium: 9.7 mg/dL (ref 8.7–10.3)
Chloride: 105 mmol/L (ref 96–106)
Creatinine, Ser: 0.77 mg/dL (ref 0.57–1.00)
GFR calc Af Amer: 90 mL/min/{1.73_m2} (ref >59)
GFR calc non Af Amer: 78 mL/min/{1.73_m2} (ref >59)
Globulin, Total: 2.2 g/dL (ref 1.5–4.5)
Glucose: 85 mg/dL (ref 65–99)
Potassium: 4.7 mmol/L (ref 3.5–5.2)
Sodium: 142 mmol/L (ref 134–144)
Total Protein: 6.7 g/dL (ref 6.0–8.5)

## 2019-08-08 LAB — LIPID PANEL
Chol/HDL Ratio: 2.3 ratio (ref 0.0–4.4)
Cholesterol, Total: 129 mg/dL (ref 100–199)
HDL: 57 mg/dL (ref >39)
LDL Chol Calc (NIH): 61 mg/dL (ref 0–99)
Triglycerides: 49 mg/dL (ref 0–149)
VLDL Cholesterol Cal: 11 mg/dL (ref 5–40)

## 2019-08-15 ENCOUNTER — Other Ambulatory Visit: Payer: Self-pay

## 2019-08-15 NOTE — Telephone Encounter (Signed)
Pt calling stating that Dr. Caryl Comes increased her medication bisoprolol 5 mg tablet to pt taking 1 tablet by mouth daily. This change is not on pt's medication list. Pt would like a call back concerning this matter. Please address

## 2019-08-16 ENCOUNTER — Telehealth: Payer: Self-pay | Admitting: Internal Medicine

## 2019-08-16 NOTE — Telephone Encounter (Signed)
Spoke with Wanda Freeman regarding the dose of Bisoprolol she takes. The patient stated, she spoke with Dr. Caryl Comes in April 2021 and was told to increase the Bisoprolol from 2.5 mg to 5 mg one tablet daily. I do not see the documentation for this. The dose I see last documented was Bisoprolol 2.5 mg one tablet daily. The patient also wanted to make sure we are aware she is taking Flecainide 50 mg one tablet twice a day. The patient will leave out of town on this Friday, Aug 18, 2019 if someone could call her back today with information. She will be out of the Bisoprolol soon.

## 2019-08-16 NOTE — Telephone Encounter (Signed)
Yes  thx

## 2019-08-16 NOTE — Telephone Encounter (Signed)
New message   Pt c/o medication issue:  1. Name of Medication: bisoprolol (ZEBETA) 5 MG tablet  2. How are you currently taking this medication (dosage and times per day)? As written  3. Are you having a reaction (difficulty breathing--STAT)? No  4. What is your medication issue? Patient needs a new prescription for this medication sent to [place], [place]

## 2019-08-16 NOTE — Telephone Encounter (Signed)
This is a [place] pt 

## 2019-08-17 MED ORDER — BISOPROLOL FUMARATE 5 MG PO TABS: 5.0000 mg | ORAL_TABLET | Freq: Every day | ORAL | 2 refills | Status: DC

## 2019-08-17 NOTE — Telephone Encounter (Signed)
Spoke with the patient. Advised her that the Rx for Bisoprolol 5mg  daily has been sent to her pharmacy [facility]. Patient verbalized understanding and voiced appreciation for the call.

## 2019-08-23 ENCOUNTER — Ambulatory Visit (INDEPENDENT_AMBULATORY_CARE_PROVIDER_SITE_OTHER): Payer: Medicare Other | Admitting: *Deleted

## 2019-08-23 DIAGNOSIS — R002 Palpitations: Secondary | ICD-10-CM | POA: Diagnosis not present

## 2019-08-23 LAB — CUP PACEART REMOTE DEVICE CHECK
Date Time Interrogation Session: 20210516150147
Implantable Pulse Generator Implant Date: 20210315

## 2019-08-24 NOTE — Progress Notes (Signed)
Carelink Summary Report / Loop Recorder 

## 2019-09-13 DIAGNOSIS — E039 Hypothyroidism, unspecified: Secondary | ICD-10-CM | POA: Diagnosis not present

## 2019-09-25 ENCOUNTER — Ambulatory Visit (INDEPENDENT_AMBULATORY_CARE_PROVIDER_SITE_OTHER): Payer: Medicare Other | Admitting: *Deleted

## 2019-09-25 DIAGNOSIS — R002 Palpitations: Secondary | ICD-10-CM

## 2019-09-25 LAB — CUP PACEART REMOTE DEVICE CHECK
Date Time Interrogation Session: 20210620230445
Implantable Pulse Generator Implant Date: 20210315

## 2019-09-25 NOTE — Progress Notes (Signed)
Carelink Summary Report / Loop Recorder 

## 2019-10-17 ENCOUNTER — Encounter: Payer: Self-pay | Admitting: Internal Medicine

## 2019-10-17 ENCOUNTER — Other Ambulatory Visit: Payer: Self-pay

## 2019-10-17 ENCOUNTER — Ambulatory Visit (INDEPENDENT_AMBULATORY_CARE_PROVIDER_SITE_OTHER): Payer: Medicare Other | Admitting: Internal Medicine

## 2019-10-17 VITALS — BP 124/78 | HR 65 | Ht 67.0 in | Wt 191.0 lb

## 2019-10-17 DIAGNOSIS — I480 Paroxysmal atrial fibrillation: Secondary | ICD-10-CM

## 2019-10-17 DIAGNOSIS — R002 Palpitations: Secondary | ICD-10-CM | POA: Diagnosis not present

## 2019-10-17 NOTE — Progress Notes (Signed)
Patient Care Team: Ernestene Kiel, MD as PCP - General (Internal Medicine) Berniece Salines, DO as PCP - Cardiology (Cardiology)   HPI  Wanda Freeman is a 69 y.o. female Seen in 2017 for symptomatic PACs for which we undertook beta blocker trials 7/17. Drug doses were targeted low because of a history of hypotension. implanted loop recorder (DOI 3/2   Episodes of presyncope.  Both events occurred while sitting.  They were sudden in onset.  Duration about 5 seconds.  Some residual instability.  Visual loss.  There is a sense of a violent motion associated with these, one took her to the right, 1 took her to the left.  It is not clear to her that it was not just her response to the event itself.    Recurrent symptoms 11/20 >> ALIVECOR-- Afib Personally reviewed; the durations have been brief.  Hence she is not anticoagulated.    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  Recurrent problems with chest pain.  Prompting catheterization.  Chest pain has been appreciated to be associated with ventricular ectopy  Episode of presyncope.  Review of the Linq 2 symptomatic episodes, one associated with nonsustained atrial tachycardia and the other with frequent ventricular ectopy as well as atrial ectopy. \ She notes also multiple episodes in the day of lightheadedness which when she looks at her apple watch is associated with an abrupt change in heart rate that then comes back down.      Palpitations have been much improved, not withstanding, on her bisoprolol which she is self uptitrated from 2.5--5 mg.  She is much improved on the flecainide.  Scant palpitations.  Energy returning.  Less fear of recurrence.  Beginning to think about exercise again.  No lightheadedness.  DATE PR interval QRSduration Dose  4/21  124 70 0  7/21 152 84 50       . DATE TEST EF   7/17 Echo 55-65%%   11/20 LHC 55-65% Cors w/o obstruction  12/20 Echo  55-73%     Thromboembolic risk factors ( age -56,  Gender-1) for a CHADSVASc Score of 2  Date Cr K TSH Hgb  1/21    0.523<<0.011     Treated hypothyroidism  Her Past Medical History:  Diagnosis Date   Thyroid disease    Vitamin D deficiency     Past Surgical History:  Procedure Laterality Date   LEFT HEART CATH AND CORONARY ANGIOGRAPHY N/A 03/06/2019   Procedure: LEFT HEART CATH AND CORONARY ANGIOGRAPHY;  Surgeon: Troy Sine, MD;  Location: [facility];  Service: Cardiovascular;  Laterality: N/A;   stab phlebectomy Left 05/25/2016   > 20 incisions by Tinnie Gens MD   stab phlebectomy Right 06/08/2016   stab phlebectomy > 20 incisions right leg by Tinnie Gens MD    TOTAL ABDOMINAL HYSTERECTOMY  2019    Current Outpatient Medications  Medication Sig Dispense Refill   atorvastatin (LIPITOR) 40 MG tablet Take 1 tablet (40 mg total) by mouth daily. 90 tablet 1   bisoprolol (ZEBETA) 5 MG tablet Take 1 tablet (5 mg total) by mouth daily. 90 tablet 2   estradiol (ESTRACE) 0.1 MG/GM vaginal cream Place 1 Applicatorful vaginally at bedtime.     flecainide (TAMBOCOR) 50 MG tablet Take 1 tablet (50 mg total) by mouth 2 (two) times daily. 180 tablet 3   SYNTHROID 100 MCG tablet Take 100 mcg by mouth daily.     Vitamin D, Ergocalciferol, (DRISDOL) 50000  units CAPS capsule Take 50,000 Units by mouth every 7 (seven) days.     No current facility-administered medications for this visit.    Allergies  Allergen Reactions   Tizanidine Other (See Comments) and Swelling      Review of Systems negative except from HPI and PMH  Physical Exam BP 124/78    Pulse 65    Ht 5\' 7"  (1.702 m)    Wt 191 lb (86.6 kg)    SpO2 96%    BMI 29.91 kg/m  Well developed and nourished in no acute distress HENT normal Neck supple with JVP-  flat   Clear Regular rate and rhythm, no murmurs or gallops Abd-soft with active BS No Clubbing cyanosis edema Skin-warm and dry A & Oriented  Grossly normal sensory and motor  function  ECG sinus at 58 Interval 15/08/42  Assessment and  Plan PAC/PVCs  Atrial fibrillation paroxysmal  Dyspnea on exertion  Lightheadedness-presyncope  Implantable loop recorder (DOI 3/21)  Feeling much improved on low-dose flecainide and bisoprolol.  With her bradycardia and borderline low blood pressure she will decide as to whether she would like to decrease her bisoprolol from 5--2-1/2 and let us know.  Dyspnea quiescient; lightheadedness better  CHA2DS2-VASc score of 2; continue off anticoagulation  Virl Axe, MD. 10/17/2019 4:59 PM     Current medicines are reviewed at length with the patient today .  The patient does not  have concerns regarding medicines.

## 2019-10-17 NOTE — Patient Instructions (Signed)
Medication Instructions:  Your physician recommends that you continue on your current medications as directed. Please refer to the Current Medication list given to you today.  Labwork: None ordered.  Testing/Procedures: None ordered.  Follow-Up: Your physician wants you to follow-up in: 6 months with Dr Klein. You will receive a reminder letter in the mail two months in advance. If you don't receive a letter, please call our office to schedule the follow-up appointment.  Remote monitoring is used to monitor your Pacemaker of ICD from home. This monitoring reduces the number of office visits required to check your device to one time per year. It allows us to keep an eye on the functioning of your device to ensure it is working properly.Any Other Special Instructions Will Be Listed Below (If Applicable).  If you need a refill on your cardiac medications before your next appointment, please call your pharmacy.   

## 2019-10-20 LAB — CUP PACEART INCLINIC DEVICE CHECK
Date Time Interrogation Session: 20210713104850
Implantable Pulse Generator Implant Date: 20210315

## 2019-10-30 ENCOUNTER — Ambulatory Visit (INDEPENDENT_AMBULATORY_CARE_PROVIDER_SITE_OTHER): Payer: Medicare Other | Admitting: *Deleted

## 2019-10-30 DIAGNOSIS — I480 Paroxysmal atrial fibrillation: Secondary | ICD-10-CM | POA: Diagnosis not present

## 2019-10-30 LAB — CUP PACEART REMOTE DEVICE CHECK
Date Time Interrogation Session: 20210725230522
Implantable Pulse Generator Implant Date: 20210315

## 2019-10-31 NOTE — Progress Notes (Signed)
Carelink Summary Report / Loop Recorder 

## 2019-11-28 ENCOUNTER — Other Ambulatory Visit: Payer: Self-pay

## 2019-11-28 ENCOUNTER — Telehealth: Payer: Self-pay

## 2019-11-28 ENCOUNTER — Other Ambulatory Visit: Payer: Medicare Other | Admitting: *Deleted

## 2019-11-28 DIAGNOSIS — D6959 Other secondary thrombocytopenia: Secondary | ICD-10-CM | POA: Diagnosis not present

## 2019-11-28 DIAGNOSIS — Z79899 Other long term (current) drug therapy: Secondary | ICD-10-CM | POA: Diagnosis not present

## 2019-11-28 DIAGNOSIS — T50905A Adverse effect of unspecified drugs, medicaments and biological substances, initial encounter: Secondary | ICD-10-CM

## 2019-11-28 NOTE — Telephone Encounter (Signed)
Spoke with pt and advised of need for lab work per Dr Caryl Comes.  Order placed for CBC with Diff.  PT verbalized understanding and states she will come this afternoon for lab draw.  Pt advised she will need to be here by 430pm.

## 2019-11-29 LAB — CBC WITH DIFFERENTIAL/PLATELET
Basophils Absolute: 0.1 10*3/uL (ref 0.0–0.2)
Basos: 1 %
EOS (ABSOLUTE): 0.2 10*3/uL (ref 0.0–0.4)
Eos: 3 %
Hematocrit: 40.7 % (ref 34.0–46.6)
Hemoglobin: 14.4 g/dL (ref 11.1–15.9)
Immature Grans (Abs): 0 10*3/uL (ref 0.0–0.1)
Immature Granulocytes: 0 %
Lymphocytes Absolute: 2.2 10*3/uL (ref 0.7–3.1)
Lymphs: 30 %
MCH: 33.6 pg — ABNORMAL HIGH (ref 26.6–33.0)
MCHC: 35.4 g/dL (ref 31.5–35.7)
MCV: 95 fL (ref 79–97)
Monocytes Absolute: 1 10*3/uL — ABNORMAL HIGH (ref 0.1–0.9)
Monocytes: 13 %
Neutrophils Absolute: 4 10*3/uL (ref 1.4–7.0)
Neutrophils: 53 %
Platelets: 233 10*3/uL (ref 150–450)
RBC: 4.28 x10E6/uL (ref 3.77–5.28)
RDW: 11.9 % (ref 11.7–15.4)
WBC: 7.4 10*3/uL (ref 3.4–10.8)

## 2019-12-03 LAB — CUP PACEART REMOTE DEVICE CHECK
Date Time Interrogation Session: 20210827230550
Implantable Pulse Generator Implant Date: 20210315

## 2019-12-04 ENCOUNTER — Ambulatory Visit (INDEPENDENT_AMBULATORY_CARE_PROVIDER_SITE_OTHER): Payer: Medicare Other | Admitting: *Deleted

## 2019-12-04 DIAGNOSIS — R002 Palpitations: Secondary | ICD-10-CM

## 2019-12-05 NOTE — Progress Notes (Signed)
Carelink Summary Report / Loop Recorder 

## 2019-12-15 DIAGNOSIS — H2513 Age-related nuclear cataract, bilateral: Secondary | ICD-10-CM | POA: Diagnosis not present

## 2019-12-15 DIAGNOSIS — H524 Presbyopia: Secondary | ICD-10-CM | POA: Diagnosis not present

## 2019-12-15 DIAGNOSIS — H04123 Dry eye syndrome of bilateral lacrimal glands: Secondary | ICD-10-CM | POA: Diagnosis not present

## 2020-01-04 ENCOUNTER — Telehealth: Payer: Self-pay | Admitting: Internal Medicine

## 2020-01-04 DIAGNOSIS — E2839 Other primary ovarian failure: Secondary | ICD-10-CM | POA: Diagnosis not present

## 2020-01-04 DIAGNOSIS — Z1339 Encounter for screening examination for other mental health and behavioral disorders: Secondary | ICD-10-CM | POA: Diagnosis not present

## 2020-01-04 DIAGNOSIS — Z0001 Encounter for general adult medical examination with abnormal findings: Secondary | ICD-10-CM | POA: Diagnosis not present

## 2020-01-04 DIAGNOSIS — Z1231 Encounter for screening mammogram for malignant neoplasm of breast: Secondary | ICD-10-CM | POA: Diagnosis not present

## 2020-01-04 DIAGNOSIS — Z1211 Encounter for screening for malignant neoplasm of colon: Secondary | ICD-10-CM | POA: Diagnosis not present

## 2020-01-04 DIAGNOSIS — E559 Vitamin D deficiency, unspecified: Secondary | ICD-10-CM | POA: Diagnosis not present

## 2020-01-04 DIAGNOSIS — Z1331 Encounter for screening for depression: Secondary | ICD-10-CM | POA: Diagnosis not present

## 2020-01-04 DIAGNOSIS — Z6830 Body mass index (BMI) 30.0-30.9, adult: Secondary | ICD-10-CM | POA: Diagnosis not present

## 2020-01-04 DIAGNOSIS — E039 Hypothyroidism, unspecified: Secondary | ICD-10-CM | POA: Diagnosis not present

## 2020-01-04 DIAGNOSIS — I491 Atrial premature depolarization: Secondary | ICD-10-CM | POA: Diagnosis not present

## 2020-01-04 LAB — CUP PACEART REMOTE DEVICE CHECK
Date Time Interrogation Session: 20210929230549
Implantable Pulse Generator Implant Date: 20210315

## 2020-01-04 NOTE — Telephone Encounter (Signed)
   [facility] calling, she is requesting copy of pt's most recent lab work. She gave fax # [phone]

## 2020-01-05 DIAGNOSIS — Z23 Encounter for immunization: Secondary | ICD-10-CM | POA: Diagnosis not present

## 2020-01-08 ENCOUNTER — Ambulatory Visit (INDEPENDENT_AMBULATORY_CARE_PROVIDER_SITE_OTHER): Payer: Medicare Other

## 2020-01-08 DIAGNOSIS — R002 Palpitations: Secondary | ICD-10-CM

## 2020-01-09 NOTE — Progress Notes (Signed)
Carelink Summary Report / Loop Recorder 

## 2020-01-11 NOTE — Telephone Encounter (Signed)
Lab results faxed 01/11/20

## 2020-01-30 DIAGNOSIS — M85851 Other specified disorders of bone density and structure, right thigh: Secondary | ICD-10-CM | POA: Diagnosis not present

## 2020-01-30 DIAGNOSIS — Z1231 Encounter for screening mammogram for malignant neoplasm of breast: Secondary | ICD-10-CM | POA: Diagnosis not present

## 2020-02-07 LAB — CUP PACEART REMOTE DEVICE CHECK
Date Time Interrogation Session: 20211101230347
Implantable Pulse Generator Implant Date: 20210315

## 2020-02-12 ENCOUNTER — Ambulatory Visit (INDEPENDENT_AMBULATORY_CARE_PROVIDER_SITE_OTHER): Payer: Medicare Other

## 2020-02-12 DIAGNOSIS — R002 Palpitations: Secondary | ICD-10-CM | POA: Diagnosis not present

## 2020-02-12 NOTE — Progress Notes (Signed)
Carelink Summary Report / Loop Recorder 

## 2020-02-13 DIAGNOSIS — Z1211 Encounter for screening for malignant neoplasm of colon: Secondary | ICD-10-CM | POA: Diagnosis not present

## 2020-02-13 DIAGNOSIS — Z1212 Encounter for screening for malignant neoplasm of rectum: Secondary | ICD-10-CM | POA: Diagnosis not present

## 2020-02-23 LAB — EXTERNAL GENERIC LAB PROCEDURE: COLOGUARD: NEGATIVE

## 2020-02-23 LAB — COLOGUARD: COLOGUARD: NEGATIVE

## 2020-03-14 DIAGNOSIS — E039 Hypothyroidism, unspecified: Secondary | ICD-10-CM | POA: Diagnosis not present

## 2020-03-16 LAB — CUP PACEART REMOTE DEVICE CHECK
Date Time Interrogation Session: 20211204230345
Implantable Pulse Generator Implant Date: 20210315

## 2020-03-18 ENCOUNTER — Ambulatory Visit (INDEPENDENT_AMBULATORY_CARE_PROVIDER_SITE_OTHER): Payer: Medicare Other

## 2020-03-18 DIAGNOSIS — R002 Palpitations: Secondary | ICD-10-CM

## 2020-04-01 NOTE — Progress Notes (Signed)
Carelink Summary Report / Loop Recorder 

## 2020-04-04 DIAGNOSIS — Z23 Encounter for immunization: Secondary | ICD-10-CM | POA: Diagnosis not present

## 2020-04-22 ENCOUNTER — Ambulatory Visit (INDEPENDENT_AMBULATORY_CARE_PROVIDER_SITE_OTHER): Payer: Medicare Other

## 2020-04-22 DIAGNOSIS — R002 Palpitations: Secondary | ICD-10-CM

## 2020-04-23 LAB — CUP PACEART REMOTE DEVICE CHECK
Date Time Interrogation Session: 20220115230526
Implantable Pulse Generator Implant Date: 20210315

## 2020-05-04 NOTE — Progress Notes (Signed)
Carelink Summary Report / Loop Recorder 

## 2020-05-13 ENCOUNTER — Encounter: Payer: Medicare Other | Admitting: Internal Medicine

## 2020-05-13 ENCOUNTER — Other Ambulatory Visit: Payer: Self-pay

## 2020-05-13 MED ORDER — ATORVASTATIN CALCIUM 40 MG PO TABS: 40.0000 mg | ORAL_TABLET | Freq: Every day | ORAL | 0 refills | Status: DC

## 2020-05-13 MED ORDER — BISOPROLOL FUMARATE 5 MG PO TABS: 5.0000 mg | ORAL_TABLET | Freq: Every day | ORAL | 0 refills | Status: DC

## 2020-05-13 NOTE — Telephone Encounter (Signed)
*  STAT* If patient is at the pharmacy, call can be transferred to refill team.   1. Which medications need to be refilled? (please list name of each medication and dose if known) Atorvastatin, Bisoprolol  2. Which pharmacy/location (including street and city if local pharmacy) is medication to be sent to? [facility]  3. Do they need a 30 day or 90 day supply? [place]

## 2020-05-21 DIAGNOSIS — Z1331 Encounter for screening for depression: Secondary | ICD-10-CM | POA: Diagnosis not present

## 2020-05-21 DIAGNOSIS — E039 Hypothyroidism, unspecified: Secondary | ICD-10-CM | POA: Diagnosis not present

## 2020-05-21 DIAGNOSIS — E785 Hyperlipidemia, unspecified: Secondary | ICD-10-CM | POA: Diagnosis not present

## 2020-05-21 DIAGNOSIS — Z79899 Other long term (current) drug therapy: Secondary | ICD-10-CM | POA: Diagnosis not present

## 2020-05-21 DIAGNOSIS — E559 Vitamin D deficiency, unspecified: Secondary | ICD-10-CM | POA: Diagnosis not present

## 2020-05-27 ENCOUNTER — Ambulatory Visit (INDEPENDENT_AMBULATORY_CARE_PROVIDER_SITE_OTHER): Payer: Medicare Other

## 2020-05-27 DIAGNOSIS — R002 Palpitations: Secondary | ICD-10-CM

## 2020-05-27 DIAGNOSIS — I480 Paroxysmal atrial fibrillation: Secondary | ICD-10-CM

## 2020-05-27 LAB — CUP PACEART REMOTE DEVICE CHECK
Date Time Interrogation Session: 20220217230742
Implantable Pulse Generator Implant Date: 20210315

## 2020-05-31 NOTE — Progress Notes (Signed)
Carelink Summary Report / Loop Recorder 

## 2020-06-29 LAB — CUP PACEART REMOTE DEVICE CHECK
Date Time Interrogation Session: 20220322230301
Implantable Pulse Generator Implant Date: 20210315

## 2020-07-01 ENCOUNTER — Ambulatory Visit (INDEPENDENT_AMBULATORY_CARE_PROVIDER_SITE_OTHER): Payer: Medicare Other

## 2020-07-01 DIAGNOSIS — I480 Paroxysmal atrial fibrillation: Secondary | ICD-10-CM | POA: Diagnosis not present

## 2020-07-02 ENCOUNTER — Encounter: Payer: Self-pay | Admitting: Internal Medicine

## 2020-07-02 ENCOUNTER — Other Ambulatory Visit: Payer: Self-pay

## 2020-07-02 ENCOUNTER — Ambulatory Visit (INDEPENDENT_AMBULATORY_CARE_PROVIDER_SITE_OTHER): Payer: Medicare Other | Admitting: Internal Medicine

## 2020-07-02 VITALS — BP 110/68 | HR 56 | Ht 67.0 in | Wt 190.1 lb

## 2020-07-02 DIAGNOSIS — R079 Chest pain, unspecified: Secondary | ICD-10-CM

## 2020-07-02 DIAGNOSIS — I480 Paroxysmal atrial fibrillation: Secondary | ICD-10-CM

## 2020-07-02 DIAGNOSIS — I491 Atrial premature depolarization: Secondary | ICD-10-CM | POA: Diagnosis not present

## 2020-07-02 DIAGNOSIS — Z959 Presence of cardiac and vascular implant and graft, unspecified: Secondary | ICD-10-CM

## 2020-07-02 DIAGNOSIS — I493 Ventricular premature depolarization: Secondary | ICD-10-CM

## 2020-07-02 MED ORDER — FLECAINIDE ACETATE 50 MG PO TABS: 50.0000 mg | ORAL_TABLET | Freq: Two times a day (BID) | ORAL | 3 refills | Status: DC

## 2020-07-02 NOTE — Patient Instructions (Signed)
Medication Instructions:  - Your physician recommends that you continue on your current medications as directed. Please refer to the Current Medication list given to you today.  *If you need a refill on your cardiac medications before your next appointment, please call your pharmacy*   Lab Work: - none ordered  If you have labs (blood work) drawn today and your tests are completely normal, you will receive your results only by: Marland Kitchen MyChart Message (if you have MyChart) OR . A paper copy in the mail If you have any lab test that is abnormal or we need to change your treatment, we will call you to review the results.   Testing/Procedures: - Your physician has requested that you have an exercise tolerance test- on a day Dr. Caryl Comes is in the office  - you may eat a light breakfast/ lunch prior to your procedure - no caffeine for 24 hours prior to your test (coffee, tea, soft drinks, or chocolate)  - no smoking/ vaping for 4 hours prior to your test - you may take your regular medications the day of your test  - bring any inhalers with you to your test - wear comfortable clothing & tennis/ non-skid shoes to walk on the treadmill     COVID PRE- TEST: You will need a COVID TEST prior to the procedure:   - Pre procedure COVID Swab:__________________(8:00 am- 2:00 pm)  - Testing location: Pre-Admit testing office, [address] in the [place] located on the [facility] campus at [address] [place], [place] [address].  - Designated parking spots for [employer] Patients are available in the parking area directly in front of the [employer]. Handicapped parking is also available for those requiring. Please do not use the Valet Service if you are coming to the Pre-Admit testing office for covid testing only, appointment time is not of sufficient length to warrant use of this service. Patients may be dropped off at the entrance if being brought by  a driver. Wheelchairs will be available within the [facility] if necessary.  - Please do not bring an additional person with you for the Covid testing appointment. Exceptions would be in the case of a parent accompanying a minor child, a legal guardian, or care giver necessary to assist a patient with disability.  - Please do not bring any children with you to the testing appointment if that child is not scheduled to have pre-procedure covid testing completed   Follow-Up: At [facility], you and your health needs are our priority.  As part of our continuing mission to provide you with exceptional heart care, we have created designated Provider Care Teams.  These Care Teams include your primary Cardiologist (physician) and Advanced Practice Providers (APPs -  Physician Assistants and Nurse Practitioners) who all work together to provide you with the care you need, when you need it.  We recommend signing up for the patient portal called "MyChart".  Sign up information is provided on this After Visit Summary.  MyChart is used to connect with patients for Virtual Visits (Telemedicine).  Patients are able to view lab/test results, encounter notes, upcoming appointments, etc.  Non-urgent messages can be sent to your provider as well.   To learn more about what you can do with MyChart, go to [email or web address].    Your next appointment:   1 year(s)  The format for your next appointment:   In Person  Provider:   Virl Axe,  MD   Other Instructions    Exercise Stress Test An exercise stress test is a test that is done to collect information about how your heart functions during exercise. The test is done while you are walking on a treadmill or using a stationary bike. The goal is to raise your heart rate and "stress" the heart. The heart is evaluated before, during, and after you exercise. An electrocardiogram (ECG) will be used to monitor the heart, and your blood pressure  will also be monitored. In some cases, nuclear scanning or an ultrasound of the heart (echocardiogram) will also be done to evaluate your heart. An exercise stress test is done to look for coronary artery disease (CAD). The test may also be done to:  Evaluate your limits of exercise during cardiac rehabilitation.  Check for high blood pressure during exercise.  Check how well you can exercise after such treatments as coronary stenting or new medicines.  Check for problems with blood flow to your arms and legs during exercise. If you have an abnormal test result, this may mean that you are not getting enough blood flow to your heart during exercise. More testing may be needed to understand why your test was not normal. Tell a health care provider about:  Any allergies you have.  All medicines you are taking, including vitamins, herbs, eye drops, creams, and over-the-counter medicines.  Any blood disorders you have.  Any surgeries you have had.  Any medical conditions you have.  Whether you are pregnant or may be pregnant. What are the risks? Generally, this is a safe procedure. However, problems may occur, including:  Pain or pressure in the following areas: ? Chest. ? Jaw or neck. ? Between your shoulder blades. ? Down your left arm.  Dizziness or lightheadedness.  Shortness of breath.  Increased or irregular heartbeats.  Nausea or vomiting.  Heart attack (rare).  Life-threatening abnormal heart rhythm (rare). What happens before the procedure?  Follow instructions from your health care provider about eating or drinking restrictions. ? You may be told to avoid all forms of caffeine for 24 hours before the test. This includes coffee, tea (even decaffeinated tea), caffeinated sodas, chocolate, cocoa, and certain pain medicines.  Ask your health care provider about: ? Taking over-the-counter medicines, vitamins, herbs, and supplements. ? Changing or stopping your  regular medicines. This is especially important if you are taking diabetes medicines or beta-blocker medicines.  If you have diabetes, ask how you are to take your insulin or pills. It is common to adjust your insulin dose the morning of the test.  If you are taking beta-blocker medicines, it is important to talk to your health care provider about these medicines well before the date of your test. Taking beta-blocker medicines may interfere with the test. In some cases, these medicines may need to be changed or stopped 24 hours or more before the test.  If you wear a nitroglycerin patch, it may need to be removed prior to the test. Ask your health care provider if the patch should be removed before the test.  If you use an inhaler for any breathing condition, bring it with you to the test.  Do not apply lotions, powders, creams, or oils on your chest prior to the test.  Wear loose-fitting clothes and comfortable walking shoes.  Do not use any products that contain nicotine or tobacco, such as cigarettes and e-cigarettes, for 4 hours before the test or as told by your health care provider.  If you need help quitting, ask your health care provider. What happens during the procedure?  Multiple electrodes will be attached to your chest.  Multiple wires will be attached to the electrodes. These will transfer the electrical impulses from your heart to the ECG machine. Your heart will be monitored both at rest and while exercising.  If you are also having an echocardiogram or nuclear scanning, images of your heart will be taken before and after you exercise.  A blood pressure cuff will be placed around your arm to measure your blood pressure throughout the test. You will feel it tighten and loosen throughout the test.  You will walk on a treadmill or use a stationary bike. If you cannot use these, you may be asked to turn a crank with your hands.  You will start at a slow pace or level on the  exercise machine. The exercise difficulty will be slowly increased to raise your heart rate. In the case of a treadmill, the speed and incline will gradually be increased.  You may be asked to periodically breathe into a tube. This measures the gases you breathe out.  You will be asked how you are feeling throughout the test. You will be asked to rate your level of exertion.  Tell the staff right away if you feel: ? Chest pain. ? Dizziness. ? Shortness of breath. ? Too fatigued to continue. ? Pain or aching in your legs or arms.  You will exercise until you have symptoms or until you reach a target heart rate. The test will also be stopped if you have changes in your blood pressure or ECG readings, or if you develop an irregular heartbeat (arrhythmia). The procedure may vary among health care providers and hospitals.   What happens after the procedure?  You will sit down and recover from the exercise. Your blood pressure, heart rate, and ECG will be monitored until you recover.  You may return to your normal schedule, including diet, activities, and medicines, unless your health care provider tells you otherwise.  It is up to you to get your test results. Ask your health care provider, or the department that is doing the test, when your results will be ready. Summary  An exercise stress test is a test that is done to collect information about how your heart functions during exercise.  This test is done to look for coronary artery disease (CAD).  During this test, you will walk on a treadmill or use an exercise bike to raise your heart rate.  It is important to follow instructions from your health care provider about eating and drinking restrictions before the test. This may include avoiding caffeine and certain medicines before the test. This information is not intended to replace advice given to you by your health care provider. Make sure you discuss any questions you have with your  health care provider. Document Revised: 11/24/2019 Document Reviewed: 11/24/2019 Elsevier Patient Education  [place].

## 2020-07-02 NOTE — Progress Notes (Signed)
Patient Care Team: Ernestene Kiel, MD as PCP - General (Internal Medicine) Berniece Salines, DO as PCP - Cardiology (Cardiology)   HPI  Wanda Freeman is a 70 y.o. female Seen in followup for symptomatic PACs for which we undertook beta blocker trials 7/17 also symptomatic PVCs; subsequently found to have atrial fibrillation-brief (on AliveCor monitor)  and has not yet anticoagulated  Drug doses were targeted low because of a history of hypotension.  Recurrent chest pain, subsequently appreciated to be associate with ventricular ectopy, prompted>> catheterization.  Chest pain has been appreciated to be associated with ventricular ectopy  Episode of presyncope.  Review of the Linq 2 symptomatic episodes, one associated with nonsustained atrial tachycardia and the other with frequent ventricular ectopy as well as atrial ectopy.   Because of ongoing issues with presyncope and lightheadedness, with her apple watch showing abrupt changes in heart rate, we undertook a implantation of a Linq recorder (3/21) demonstrating nonsustained atrial tachycardia with frequent atrial and ventricular ectopy.  Treated with a combination of bisoprolol and flecainide  Palpitations are largely quiescient.  Occasional brief palpitations with some lightheadedness.  Recurrent issues with chest discomfort associated with exertion either at yoga or walking.  Predictable.  Relieved by rest.  Continues to have a negative psychological impact not withstanding her normal catheterization such that she is not exercising regularly           DATE PR interval QRSduration Dose  4/21  124 70 0  7/21 152 84 50  3/22 144 84 50       . DATE TEST EF   7/17 Echo 55-65%%   11/20 LHC 55-65% Cors w/o obstruction  12/20 Echo  22-02%     Thromboembolic risk factors ( age -70, Gender-1) for a CHADSVASc Score of 2  Date Cr K TSH Hgb  1/21    0.523<<0.011    5/21 0.77 4.7  14.4 (8/21)   Treated  hypothyroidism  Her Past Medical History:  Diagnosis Date  . Thyroid disease   . Vitamin D deficiency     Past Surgical History:  Procedure Laterality Date  . LEFT HEART CATH AND CORONARY ANGIOGRAPHY N/A 03/06/2019   Procedure: LEFT HEART CATH AND CORONARY ANGIOGRAPHY;  Surgeon: Troy Sine, MD;  Location: [facility];  Service: Cardiovascular;  Laterality: N/A;  . stab phlebectomy Left 05/25/2016   > 20 incisions by Tinnie Gens MD  . stab phlebectomy Right 06/08/2016   stab phlebectomy > 20 incisions right leg by Tinnie Gens MD   . TOTAL ABDOMINAL HYSTERECTOMY  2019    Current Outpatient Medications  Medication Sig Dispense Refill  . atorvastatin (LIPITOR) 40 MG tablet Take 1 tablet (40 mg total) by mouth daily. 90 tablet 0  . bisoprolol (ZEBETA) 5 MG tablet Take 1 tablet (5 mg total) by mouth daily. 90 tablet 0  . estradiol (ESTRACE) 0.1 MG/GM vaginal cream Place 1 Applicatorful vaginally at bedtime.    . flecainide (TAMBOCOR) 50 MG tablet Take 1 tablet (50 mg total) by mouth 2 (two) times daily. 180 tablet 3  . SYNTHROID 100 MCG tablet Take 100 mcg by mouth daily.    . Vitamin D, Ergocalciferol, (DRISDOL) 50000 units CAPS capsule Take 50,000 Units by mouth every 7 (seven) days.     No current facility-administered medications for this visit.    Allergies  Allergen Reactions  . Tizanidine Other (See Comments) and Swelling      Review of Systems  negative except from HPI and PMH  Physical Exam BP 110/68 (BP Location: Left Arm, Patient Position: Sitting, Cuff Size: Normal)   Pulse (!) 56   Ht 5\' 7"  (1.702 m)   Wt 190 lb 2 oz (86.2 kg)   SpO2 97%   BMI 29.78 kg/m  Well developed and well nourished in no acute distress HENT normal Neck supple with JVP-flat Clear Device pocket well healed; without hematoma or erythema.  There is no tethering  Regular rate and rhythm, no   murmur Abd-soft with active BS No Clubbing cyanosis   edema Skin-warm and dry A  & Oriented  Grossly normal sensory and motor function  ECG sinus at 56 Intervals 14/08/43 Assessment and  Plan PAC/PVCs  Atrial fibrillation paroxysmal  Exertional chest discomfort  Lightheadedness-presyncope  Implantable loop recorder (DOI 3/21)  Continues to feel much improved on low-dose flecainide and bisoprolol.  Her intermittent palpitations could have many mechanisms.  Have advised her to use the activator on her event recorder to try to elucidate.  Chest discomfort with exertion in the absence of epicardial disease could be small vessel disease or noncardiac.  Given her reluctance to exercise, suggested either empiric preexercise nitrates or treadmill testing with and without nitrates.  A large part of her reluctance I think is the anxiety related to exercise hence, have elected to proceed doing it by treadmill testing.  Virl Axe, MD. 07/02/2020 10:42 AM     Current medicines are reviewed at length with the patient today .  The patient does not  have concerns regarding medicines.

## 2020-07-04 ENCOUNTER — Telehealth: Payer: Self-pay | Admitting: Internal Medicine

## 2020-07-04 NOTE — Telephone Encounter (Signed)
Wanda Freeman- We shouldn't wait until June to do that.   The patient was going to call back anyway to schedule, but I think she was leaning towards [facility] anyway.   So, that sounds like it may be the better option.

## 2020-07-04 NOTE — Telephone Encounter (Signed)
[facility] - Next available klein ETT day with no echo schedule is June 7th.  Is this an acceptable timeframe?  Or   [place] - Can the treadmill schedule for [address] be used for this ETT on April 12th ?

## 2020-07-08 DIAGNOSIS — E039 Hypothyroidism, unspecified: Secondary | ICD-10-CM | POA: Diagnosis not present

## 2020-07-10 NOTE — Telephone Encounter (Signed)
Per [place] attempt made to contact patient for scheduling .

## 2020-07-11 NOTE — Progress Notes (Signed)
Carelink Summary Report / Loop Recorder 

## 2020-07-29 ENCOUNTER — Ambulatory Visit (INDEPENDENT_AMBULATORY_CARE_PROVIDER_SITE_OTHER): Payer: Medicare Other

## 2020-07-29 DIAGNOSIS — I491 Atrial premature depolarization: Secondary | ICD-10-CM

## 2020-07-29 DIAGNOSIS — I480 Paroxysmal atrial fibrillation: Secondary | ICD-10-CM

## 2020-07-31 LAB — CUP PACEART REMOTE DEVICE CHECK
Date Time Interrogation Session: 20220424230530
Implantable Pulse Generator Implant Date: 20210315

## 2020-08-03 ENCOUNTER — Other Ambulatory Visit: Payer: Self-pay | Admitting: Internal Medicine

## 2020-08-14 ENCOUNTER — Other Ambulatory Visit: Payer: Self-pay | Admitting: *Deleted

## 2020-08-15 ENCOUNTER — Other Ambulatory Visit: Payer: Self-pay | Admitting: Internal Medicine

## 2020-08-15 DIAGNOSIS — R079 Chest pain, unspecified: Secondary | ICD-10-CM

## 2020-08-15 NOTE — Progress Notes (Signed)
Carelink Summary Report / Loop Recorder 

## 2020-08-23 ENCOUNTER — Other Ambulatory Visit (HOSPITAL_COMMUNITY): Payer: Medicare Other

## 2020-08-25 DIAGNOSIS — Z9889 Other specified postprocedural states: Secondary | ICD-10-CM | POA: Insufficient documentation

## 2020-08-27 ENCOUNTER — Other Ambulatory Visit: Payer: Self-pay

## 2020-08-27 ENCOUNTER — Ambulatory Visit: Payer: Medicare Other | Admitting: Internal Medicine

## 2020-08-27 ENCOUNTER — Ambulatory Visit (INDEPENDENT_AMBULATORY_CARE_PROVIDER_SITE_OTHER): Payer: Medicare Other

## 2020-08-27 DIAGNOSIS — R079 Chest pain, unspecified: Secondary | ICD-10-CM

## 2020-08-27 DIAGNOSIS — I480 Paroxysmal atrial fibrillation: Secondary | ICD-10-CM

## 2020-08-27 DIAGNOSIS — R002 Palpitations: Secondary | ICD-10-CM

## 2020-08-27 DIAGNOSIS — Z9889 Other specified postprocedural states: Secondary | ICD-10-CM

## 2020-08-27 LAB — EXERCISE TOLERANCE TEST
Estimated workload: 7.6 METS
Exercise duration (min): 4 min
Exercise duration (sec): 30 s
MPHR: 149 {beats}/min
Peak HR: 146 {beats}/min
Percent HR: 97 %
RPE: 15
Rest HR: 146 {beats}/min

## 2020-09-02 LAB — CUP PACEART REMOTE DEVICE CHECK
Date Time Interrogation Session: 20220527230314
Implantable Pulse Generator Implant Date: 20210315

## 2020-09-03 ENCOUNTER — Ambulatory Visit (INDEPENDENT_AMBULATORY_CARE_PROVIDER_SITE_OTHER): Payer: Medicare Other

## 2020-09-03 DIAGNOSIS — I480 Paroxysmal atrial fibrillation: Secondary | ICD-10-CM | POA: Diagnosis not present

## 2020-09-25 NOTE — Progress Notes (Signed)
Carelink Summary Report / Loop Recorder 

## 2020-10-03 ENCOUNTER — Encounter: Payer: Self-pay | Admitting: Internal Medicine

## 2020-10-03 ENCOUNTER — Other Ambulatory Visit: Payer: Self-pay

## 2020-10-03 MED ORDER — NITROGLYCERIN 0.4 MG SL SUBL: 0.4000 mg | SUBLINGUAL_TABLET | SUBLINGUAL | 2 refills | Status: DC | PRN

## 2020-10-03 MED ORDER — CARVEDILOL 6.25 MG PO TABS: 6.2500 mg | ORAL_TABLET | Freq: Two times a day (BID) | ORAL | 3 refills | Status: DC

## 2020-10-03 NOTE — Progress Notes (Signed)
Per request of Dr Caryl Comes, pt needs to discontinue Bisoprolol and begin Carvedilol 6.25mg  - 1 tablet by mouth bid #180 with 3RF and Nitro SL 0.4mg  - 1 tablet by mouth every 5 minutes x 3 doses prn. #25 with 2 RF.  Medications sent to pharmacy on file. Pt is aware of medication changes.

## 2020-10-03 NOTE — Progress Notes (Unsigned)
Treadmill demonstrated ST segment depression beginning in a modified stage II with progression through stage II to stage III with 2 mm of horizontal depression persisting into recovery at 1 mm with no downsloping as long as 7  minutes  Normal coronary arteries.  Consistent with INOCA  Discussed with Dr. Tamala Julian.  Confirming the diagnosis.  Discussed with the patient's husband, we will arrange consultation with Dr. Tamala Julian.  We will discontinue the bisoprolol being used adjunctively for her arrhythmia and try her on carvedilol as opposed to adding a calcium blocker to the bisoprolol given the fact that she is having some breast discomfort in addition to her exertional discomfort.  We will also give her prescription for sublingual nitroglycerin to take prior to exercise.

## 2020-10-04 ENCOUNTER — Ambulatory Visit (INDEPENDENT_AMBULATORY_CARE_PROVIDER_SITE_OTHER): Payer: Medicare Other

## 2020-10-04 DIAGNOSIS — I480 Paroxysmal atrial fibrillation: Secondary | ICD-10-CM | POA: Diagnosis not present

## 2020-10-04 LAB — CUP PACEART REMOTE DEVICE CHECK
Date Time Interrogation Session: 20220629230605
Implantable Pulse Generator Implant Date: 20210315

## 2020-10-23 NOTE — Progress Notes (Signed)
Carelink Summary Report / Loop Recorder 

## 2020-10-24 ENCOUNTER — Other Ambulatory Visit: Payer: Self-pay

## 2020-10-24 MED ORDER — NIFEDIPINE ER 30 MG PO TB24: 30.0000 mg | ORAL_TABLET | Freq: Every day | ORAL | 1 refills | Status: DC

## 2020-10-24 NOTE — Addendum Note (Signed)
Addended by: Thora Lance on: 10/24/2020 06:21 PM   Modules accepted: Orders

## 2020-10-24 NOTE — Progress Notes (Signed)
Per Dr Caryl Comes pt should begin Nifedipine 30mg  - 1 tablet by mouth daily.  Rx sent to pharmacy on file.

## 2020-11-05 LAB — CUP PACEART REMOTE DEVICE CHECK
Date Time Interrogation Session: 20220801230227
Implantable Pulse Generator Implant Date: 20210315

## 2020-11-06 ENCOUNTER — Ambulatory Visit (INDEPENDENT_AMBULATORY_CARE_PROVIDER_SITE_OTHER): Payer: Medicare Other

## 2020-11-06 DIAGNOSIS — I480 Paroxysmal atrial fibrillation: Secondary | ICD-10-CM
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
## 2020-11-11 ENCOUNTER — Other Ambulatory Visit: Payer: Self-pay | Admitting: *Deleted

## 2020-11-11 ENCOUNTER — Ambulatory Visit (INDEPENDENT_AMBULATORY_CARE_PROVIDER_SITE_OTHER): Payer: Medicare Other | Admitting: Interventional Cardiology

## 2020-11-11 ENCOUNTER — Encounter: Payer: Self-pay | Admitting: Interventional Cardiology

## 2020-11-11 VITALS — BP 118/72 | HR 66 | Ht 67.0 in | Wt 189.0 lb

## 2020-11-11 DIAGNOSIS — E7800 Pure hypercholesterolemia, unspecified: Secondary | ICD-10-CM | POA: Diagnosis not present

## 2020-11-11 DIAGNOSIS — R072 Precordial pain: Secondary | ICD-10-CM | POA: Diagnosis not present

## 2020-11-11 DIAGNOSIS — R079 Chest pain, unspecified: Secondary | ICD-10-CM | POA: Diagnosis not present

## 2020-11-11 DIAGNOSIS — Z95818 Presence of other cardiac implants and grafts: Secondary | ICD-10-CM

## 2020-11-11 DIAGNOSIS — I480 Paroxysmal atrial fibrillation: Secondary | ICD-10-CM

## 2020-11-11 MED ORDER — CARVEDILOL 12.5 MG PO TABS: 12.5000 mg | ORAL_TABLET | Freq: Two times a day (BID) | ORAL | 3 refills | Status: DC

## 2020-11-11 NOTE — Patient Instructions (Signed)
Medication Instructions:  1) DISCONTINUE Nifedipine 2) INCREASE Carvedilol to 12.'5mg'$  twice daily 3) Use your Nitro when you are having chest pain.  *If you need a refill on your cardiac medications before your next appointment, please call your pharmacy*   Lab Work: BMET today  If you have labs (blood work) drawn today and your tests are completely normal, you will receive your results only by: [place] (if you have MyChart) OR A paper copy in the mail If you have any lab test that is abnormal or we need to change your treatment, we will call you to review the results.   Testing/Procedures: Your physician recommends that you have a Coronary CT performed.  See below for instructions.   Follow-Up: At [facility], you and your health needs are our priority.  As part of our continuing mission to provide you with exceptional heart care, we have created designated Provider Care Teams.  These Care Teams include your primary Cardiologist (physician) and Advanced Practice Providers (APPs -  Physician Assistants and Nurse Practitioners) who all work together to provide you with the care you need, when you need it.  We recommend signing up for the patient portal called "MyChart".  Sign up information is provided on this After Visit Summary.  MyChart is used to connect with patients for Virtual Visits (Telemedicine).  Patients are able to view lab/test results, encounter notes, upcoming appointments, etc.  Non-urgent messages can be sent to your provider as well.   To learn more about what you can do with MyChart, go to [email or web address].    Your next appointment:   2-3 month(s)  The format for your next appointment:   In Person  Provider:   You may see Daneen Schick, MD or one of the following Advanced Practice Providers on your designated Care Team:   Cecilie Kicks, NP     Other Instructions   Your cardiac CT will be scheduled at one of the below locations:   [facility] [address] [place], [place] [address] [phone]  [place] [address] [place],  [address] [phone]  If scheduled at [facility], please arrive at the [facility] main entrance (entrance A) of [facility] 30 minutes prior to test start time. Proceed to the [facility] Radiology Department (first floor) to check-in and test prep.  If scheduled at [facility], please arrive 15 mins early for check-in and test prep.  Please follow these instructions carefully (unless otherwise directed):  Hold all erectile dysfunction medications at least 3 days (72 hrs) prior to test.  On the Night Before the Test: Be sure to Drink plenty of water. Do not consume any caffeinated/decaffeinated beverages or chocolate 12 hours prior to your test. Do not take any antihistamines 12 hours prior to your test.  On the Day of the Test: Drink plenty of water until 1 hour prior to the test. Do not eat any food 4 hours prior to the test. You may take your regular medications prior to the test.  Take your Carvedilol 2 hours prior to your CT. HOLD Furosemide/Hydrochlorothiazide morning of the test. FEMALES- please wear underwire-free bra if available, avoid dresses & tight clothing       After the Test: Drink plenty of water. After receiving IV contrast, you may experience a mild flushed feeling. This is normal. On occasion, you may experience a mild rash up to 24 hours  after the test. This is not dangerous. If this occurs, you can take Benadryl 25 mg and increase your fluid intake. If you experience trouble breathing, this can be serious. If it is severe call 911 IMMEDIATELY. If it is mild, please call our office. If you take any of these medications: Glipizide/Metformin, Avandament, Glucavance, please do not take 48 hours after completing test unless otherwise  instructed.  Please allow 2-4 weeks for scheduling of routine cardiac CTs. Some insurance companies require a pre-authorization which may delay scheduling of this test.   For non-scheduling related questions, please contact the cardiac imaging nurse navigator should you have any questions/concerns: Marchia Bond, Cardiac Imaging Nurse Navigator Gordy Clement, Cardiac Imaging Nurse Navigator  [facility] Direct Office Dial: [phone]   For scheduling needs, including cancellations and rescheduling, please call [place], [phone].

## 2020-11-12 LAB — BASIC METABOLIC PANEL
BUN/Creatinine Ratio: 18 (ref 12–28)
BUN: 13 mg/dL (ref 8–27)
CO2: 21 mmol/L (ref 20–29)
Calcium: 10 mg/dL (ref 8.7–10.3)
Chloride: 103 mmol/L (ref 96–106)
Creatinine, Ser: 0.72 mg/dL (ref 0.57–1.00)
Glucose: 88 mg/dL (ref 65–99)
Potassium: 4.5 mmol/L (ref 3.5–5.2)
Sodium: 141 mmol/L (ref 134–144)
eGFR: 89 mL/min/{1.73_m2} (ref >59)

## 2020-11-19 ENCOUNTER — Telehealth (HOSPITAL_COMMUNITY): Payer: Self-pay | Admitting: *Deleted

## 2020-11-19 NOTE — Telephone Encounter (Signed)
Reaching out to patient to offer assistance regarding upcoming cardiac imaging study; pt verbalizes understanding of appt date/time, parking situation and where to check in, pre-test NPO status and medications ordered, and verified current allergies; name and call back number provided for further questions should they arise  Gordy Clement RN Navigator Cardiac Imaging [facility] [phone] office [phone] cell  Patient to take 12.'5mg'$  carvedilol two hours prior to cardiac CT. Pt think she got a rash on her legs the day after her last CT scans many years ago.

## 2020-11-20 ENCOUNTER — Ambulatory Visit (HOSPITAL_COMMUNITY)
Admission: RE | Admit: 2020-11-20 | Discharge: 2020-11-20 | Disposition: A | Discharge status: Home / Self Care | Payer: Medicare Other | Source: Ambulatory Visit | Attending: Interventional Cardiology | Admitting: Interventional Cardiology

## 2020-11-20 ENCOUNTER — Other Ambulatory Visit: Payer: Self-pay

## 2020-11-20 DIAGNOSIS — R072 Precordial pain: Secondary | ICD-10-CM | POA: Insufficient documentation

## 2020-11-20 MED ORDER — NITROGLYCERIN 0.4 MG SL SUBL
0.8000 mg | SUBLINGUAL_TABLET | Freq: Once | SUBLINGUAL | Status: AC
  Administered 2020-11-20: 0.8 mg via SUBLINGUAL

## 2020-11-20 MED ORDER — NITROGLYCERIN 0.4 MG SL SUBL
SUBLINGUAL_TABLET | SUBLINGUAL | Status: AC
  Filled 2020-11-20: qty 2

## 2020-11-20 MED ORDER — IOHEXOL 350 MG/ML SOLN
95.0000 mL | Freq: Once | INTRAVENOUS | Status: AC | PRN
  Administered 2020-11-20: 95 mL via INTRAVENOUS

## 2020-11-27 DIAGNOSIS — I10 Essential (primary) hypertension: Secondary | ICD-10-CM | POA: Diagnosis not present

## 2020-11-27 DIAGNOSIS — I208 Other forms of angina pectoris: Secondary | ICD-10-CM | POA: Diagnosis not present

## 2020-11-27 DIAGNOSIS — I209 Angina pectoris, unspecified: Secondary | ICD-10-CM | POA: Diagnosis not present

## 2020-11-27 DIAGNOSIS — Z79899 Other long term (current) drug therapy: Secondary | ICD-10-CM | POA: Diagnosis not present

## 2020-12-02 NOTE — Progress Notes (Signed)
Carelink Summary Report 

## 2020-12-05 ENCOUNTER — Other Ambulatory Visit: Payer: Self-pay | Admitting: Internal Medicine

## 2020-12-06 DIAGNOSIS — I208 Other forms of angina pectoris: Secondary | ICD-10-CM | POA: Diagnosis not present

## 2020-12-10 ENCOUNTER — Ambulatory Visit (INDEPENDENT_AMBULATORY_CARE_PROVIDER_SITE_OTHER): Payer: Medicare Other

## 2020-12-10 DIAGNOSIS — I480 Paroxysmal atrial fibrillation: Secondary | ICD-10-CM

## 2020-12-10 LAB — CUP PACEART REMOTE DEVICE CHECK
Date Time Interrogation Session: 20220903230643
Implantable Pulse Generator Implant Date: 20210315

## 2020-12-13 DIAGNOSIS — U071 COVID-19: Secondary | ICD-10-CM | POA: Diagnosis not present

## 2020-12-15 NOTE — Telephone Encounter (Unsigned)
Recommend stopping Nifedipine to allow higher BP. Start Ranolazine 500 mg BID. Use SL NTG for episodes of VP > 5 minutes in duration. Increase Carvedilol to 12.5 mg BID. If this regimen is associated with feeling worse, revert to the present regimen which seems to be doing fairly well. Keep me posted on how you feel.

## 2020-12-16 ENCOUNTER — Other Ambulatory Visit: Payer: Self-pay | Admitting: *Deleted

## 2020-12-16 MED ORDER — CARVEDILOL 12.5 MG PO TABS
12.5000 mg | ORAL_TABLET | Freq: Two times a day (BID) | ORAL | 3 refills | Status: DC
Start: 2020-12-16 — End: 2022-03-10

## 2020-12-16 MED ORDER — RANOLAZINE ER 500 MG PO TB12: 500.0000 mg | ORAL_TABLET | Freq: Two times a day (BID) | ORAL | 11 refills | Status: DC

## 2020-12-17 DIAGNOSIS — H2513 Age-related nuclear cataract, bilateral: Secondary | ICD-10-CM | POA: Diagnosis not present

## 2020-12-17 DIAGNOSIS — H524 Presbyopia: Secondary | ICD-10-CM | POA: Diagnosis not present

## 2020-12-17 DIAGNOSIS — H5203 Hypermetropia, bilateral: Secondary | ICD-10-CM | POA: Diagnosis not present

## 2020-12-18 NOTE — Progress Notes (Signed)
Carelink Summary Report / Loop Recorder 

## 2020-12-31 DIAGNOSIS — M7062 Trochanteric bursitis, left hip: Secondary | ICD-10-CM | POA: Diagnosis not present

## 2021-01-06 DIAGNOSIS — I208 Other forms of angina pectoris: Secondary | ICD-10-CM | POA: Diagnosis not present

## 2021-01-12 NOTE — Progress Notes (Signed)
Cardiology Office Note:    Date:  01/13/2021   ID:  Wanda Freeman, DOB 09-11-1950, MRN [id number]  PCP:  Ernestene Kiel, MD  Cardiologist:  Sinclair Grooms, MD   Referring MD: Ernestene Kiel, MD   Chief Complaint  Patient presents with   Coronary Artery Disease    Microvascular angina    History of Present Illness:    Wanda Freeman is a 70 y.o. female with a hx of PAF, symptomatic chest pain with PAC's and PVC's, suppression of ectopy with therapy, and exertional CP with patent epicardial coronary arteries, abnormal ischemic ETT, and hypertensive BP response (08/2020). Referred for consideration of microvascular dysfunction.  Instructions on December 13, 2020: Recommend stopping Nifedipine to allow higher BP. Start Ranolazine 500 mg BID. Use SL NTG for episodes of CP > 5 minutes in duration. Increase Carvedilol to 12.5 mg BID. If this regimen is associated with feeling worse, revert to the present regimen which seems to be doing fairly well. Keep me posted on how you feel.    The current regimen is ranolazine 500 mg twice daily, carvedilol 12.5 mg twice daily, atorvastatin 40 mg/day, and for PAF flecainide 50 mg twice daily.  She also takes Synthroid 100 mcg/day.  Using as needed sublingual nitroglycerin.  Not on an ARB.  Is involved in cardiac rehab.  After starting however developed bursitis in her hip which is slowed progress.  Had to go on high-dose steroids for that problem and noted that the chest pain went away until the steroids were weaned.  Now off of prednisone, she has having similar symptoms.  On the above regimen, she is doing better than she was before.   Past Medical History:  Diagnosis Date   Thyroid disease    Vitamin D deficiency     Past Surgical History:  Procedure Laterality Date   LEFT HEART CATH AND CORONARY ANGIOGRAPHY N/A 03/06/2019   Procedure: LEFT HEART CATH AND CORONARY ANGIOGRAPHY;  Surgeon: Troy Sine, MD;  Location: [facility];  Service: Cardiovascular;  Laterality: N/A;   stab phlebectomy Left 05/25/2016   > 20 incisions by Tinnie Gens MD   stab phlebectomy Right 06/08/2016   stab phlebectomy > 20 incisions right leg by Tinnie Gens MD    TOTAL ABDOMINAL HYSTERECTOMY  2019    Current Medications: Current Meds  Medication Sig   aspirin EC 81 MG tablet Take 81 mg by mouth daily. Swallow whole.   atorvastatin (LIPITOR) 40 MG tablet TAKE 1 TABLET BY MOUTH EVERY DAY   carvedilol (COREG) 12.5 MG tablet Take 1 tablet (12.5 mg total) by mouth 2 (two) times daily.   estradiol (ESTRACE) 0.1 MG/GM vaginal cream Place 1 Applicatorful vaginally at bedtime.   flecainide (TAMBOCOR) 50 MG tablet Take 1 tablet (50 mg total) by mouth 2 (two) times daily.   losartan (COZAAR) 25 MG tablet Take 1 tablet (25 mg total) by mouth daily.   nitroGLYCERIN (NITROSTAT) 0.4 MG SL tablet PLACE 1 TABLET UNDER THE TONGUE EVERY 5 MINUTES AS NEEDED FOR CHEST PAIN.   ranolazine (RANEXA) 500 MG 12 hr tablet Take 1 tablet (500 mg total) by mouth 2 (two) times daily.   SYNTHROID 100 MCG tablet Take 100 mcg by mouth daily.   Vitamin D, Ergocalciferol, (DRISDOL) 50000 units CAPS capsule Take 50,000 Units by mouth every 7 (seven) days.     Allergies:   Tizanidine   Social History   Socioeconomic History   Marital status:  Married    Spouse name: Not on file   Number of children: Not on file   Years of education: Not on file   Highest education level: Not on file  Occupational History   Not on file  Tobacco Use   Smoking status: Never   Smokeless tobacco: Never  Vaping Use   Vaping Use: Never used  Substance and Sexual Activity   Alcohol use: Yes    Comment: occasional   Drug use: No   Sexual activity: Not on file  Other Topics Concern   Not on file  Social History Narrative   Not on file   Social Determinants of Health   Financial Resource Strain: Not on file  Food Insecurity: Not on file  Transportation Needs:  Not on file  Physical Activity: Not on file  Stress: Not on file  Social Connections: Not on file     Family History: The patient's Family history is unknown by patient.  ROS:   Please see the history of present illness.    Not using nitroglycerin very much because it causes low blood pressure and she feels terrible.  She is concerned about the possible PFO.  I reassured her that the PFO is a relatively common finding and requires no treatment at this time.  All other systems reviewed and are negative.  EKGs/Labs/Other Studies Reviewed:    The following studies were reviewed today:  COR CTA 11/2020: IMPRESSION: 1. Calcium score 2.9 which is only 36 th percentile for age/sex   2.  CAD RADS 1 non obstructive coronary artery disease   3.  Normal aortic root 3.1 cm   4.  Possible small PFO   Patient will f/u with Dr Tamala Julian for further w/u and Rx of presumed microvascular angina    EKG:  EKG not performed  Recent Labs: 11/11/2020: BUN 13; Creatinine, Ser 0.72; Potassium 4.5; Sodium 141  Recent Lipid Panel    Component Value Date/Time   CHOL 129 08/08/2019 0905   TRIG 49 08/08/2019 0905   HDL 57 08/08/2019 0905   CHOLHDL 2.3 08/08/2019 0905   LDLCALC 61 08/08/2019 0905    Physical Exam:    VS:  BP 128/78   Pulse 65   Ht '5\' 7"'  (1.702 m)   Wt 187 lb 6.4 oz (85 kg)   SpO2 97%   BMI 29.35 kg/m     Wt Readings from Last 3 Encounters:  01/13/21 187 lb 6.4 oz (85 kg)  11/11/20 189 lb (85.7 kg)  07/02/20 190 lb 2 oz (86.2 kg)     GEN: Overweight. No acute distress HEENT: Normal NECK: No JVD. LYMPHATICS: No lymphadenopathy CARDIAC: No murmur. RRR no gallop, or edema. VASCULAR:  Normal Pulses. No bruits. RESPIRATORY:  Clear to auscultation without rales, wheezing or rhonchi  ABDOMEN: Soft, non-tender, non-distended, No pulsatile mass, MUSCULOSKELETAL: No deformity  SKIN: Warm and dry NEUROLOGIC:  Alert and oriented x 3 PSYCHIATRIC:  Normal affect   ASSESSMENT:     1. Microvascular angina (HCC)   2. Paroxysmal atrial fibrillation (HCC)   3. Premature ventricular contractions   4. Hypercholesteremia   5. Precordial pain    PLAN:    In order of problems listed above:  She has been better on ranolazine.  Long-acting nitrates have been discontinued.  Sublingual nitro is not being used because of side effects.  Add losartan 12.5 mg/day and increase to 25 mg/day after 1 week.  In light of the improvement in symptoms on high-dose  steroids, an inflammatory component may be present.  We will do an ANA, rheumatoid factor, sed rate, and hs-CRP level.  If significant evidence of inflammation, will consider colchicine.  If losartan does not help, consider increasing ranolazine to 1000 mg twice daily. Continue flecainide.  Rule out drug interaction with ranolazine. Not discussed Continue statin therapy As needed use of sublingual nitroglycerin for chest pain.   Medication Adjustments/Labs and Tests Ordered: Current medicines are reviewed at length with the patient today.  Concerns regarding medicines are outlined above.  Orders Placed This Encounter  Procedures   Basic metabolic panel   Antinuclear Antib (ANA)   Sedimentation rate   CRP High sensitivity   Rheumatoid Factor    Meds ordered this encounter  Medications   losartan (COZAAR) 25 MG tablet    Sig: Take 1 tablet (25 mg total) by mouth daily.    Dispense:  90 tablet    Refill:  3     Patient Instructions  Medication Instructions:  1) START Losartan 12.29m once daily.  In 2 weeks, if tolerating the 12.556mdose, please increase Losartan to 2584mnce daily.   *If you need a refill on your cardiac medications before your next appointment, please call your pharmacy*   Lab Work: BMET, ANA, ESR, HS-CRP, and Rheumatoid Factor in 1 month.  If you have labs (blood work) drawn today and your tests are completely normal, you will receive your results only by: MyCB[place]f you have  MyChart) OR A paper copy in the mail If you have any lab test that is abnormal or we need to change your treatment, we will call you to review the results.   Testing/Procedures: None   Follow-Up: [facility] and your health needs are our priority.  As part of our continuing mission to provide you with exceptional heart care, we have created designated Provider Care Teams.  These Care Teams include your primary Cardiologist (physician) and Advanced Practice Providers (APPs -  Physician Assistants and Nurse Practitioners) who all work together to provide you with the care you need, when you need it.  We recommend signing up for the patient portal called "MyChart".  Sign up information is provided on this After Visit Summary.  MyChart is used to connect with patients for Virtual Visits (Telemedicine).  Patients are able to view lab/test results, encounter notes, upcoming appointments, etc.  Non-urgent messages can be sent to your provider as well.   To learn more about what you can do with MyChart, go to [email or web address]  Your next appointment:   4-6 month(s)  The format for your next appointment:   In Person  Provider:   You may see HenSinclair GroomsD or one of the following Advanced Practice Providers on your designated Care Team:   LauCecilie KicksP   Other Instructions     Signed, HenSinclair GroomsD  01/13/2021 2:10 PM    [place]

## 2021-01-13 ENCOUNTER — Ambulatory Visit (INDEPENDENT_AMBULATORY_CARE_PROVIDER_SITE_OTHER): Payer: Medicare Other

## 2021-01-13 ENCOUNTER — Other Ambulatory Visit: Payer: Self-pay

## 2021-01-13 ENCOUNTER — Encounter: Payer: Self-pay | Admitting: Interventional Cardiology

## 2021-01-13 ENCOUNTER — Ambulatory Visit (INDEPENDENT_AMBULATORY_CARE_PROVIDER_SITE_OTHER): Payer: Medicare Other | Admitting: Interventional Cardiology

## 2021-01-13 VITALS — BP 128/78 | HR 65 | Ht 67.0 in | Wt 187.4 lb

## 2021-01-13 DIAGNOSIS — I480 Paroxysmal atrial fibrillation: Secondary | ICD-10-CM | POA: Diagnosis not present

## 2021-01-13 DIAGNOSIS — E7800 Pure hypercholesterolemia, unspecified: Secondary | ICD-10-CM

## 2021-01-13 DIAGNOSIS — I493 Ventricular premature depolarization: Secondary | ICD-10-CM | POA: Diagnosis not present

## 2021-01-13 DIAGNOSIS — R072 Precordial pain: Secondary | ICD-10-CM

## 2021-01-13 DIAGNOSIS — I208 Other forms of angina pectoris: Secondary | ICD-10-CM

## 2021-01-13 MED ORDER — LOSARTAN POTASSIUM 25 MG PO TABS: 25.0000 mg | ORAL_TABLET | Freq: Every day | ORAL | 3 refills | Status: DC

## 2021-01-13 NOTE — Patient Instructions (Addendum)
Medication Instructions:  1) START Losartan 12.72m once daily.  In 2 weeks, if tolerating the 12.5100mdose, please increase Losartan to 2520mnce daily.   *If you need a refill on your cardiac medications before your next appointment, please call your pharmacy*   Lab Work: BMET, ANA, ESR, HS-CRP, and Rheumatoid Factor in 1 month.  If you have labs (blood work) drawn today and your tests are completely normal, you will receive your results only by: MyCLake [place]awanaf you have MyChart) OR A paper copy in the mail If you have any lab test that is abnormal or we need to change your treatment, we will call you to review the results.   Testing/Procedures: None   Follow-Up: [facility] and your [facility] needs are our priority.  As part of our continuing mission to provide you with exceptional heart care, we have created designated Provider Care Teams.  These Care Teams include your primary Cardiologist (physician) and Advanced Practice Providers (APPs -  Physician Assistants and Nurse Practitioners) who all work together to provide you with the care you need, when you need it.  We recommend signing up for the patient portal called "MyChart".  Sign up information is provided on this After Visit Summary.  MyChart is used to connect with patients for Virtual Visits (Telemedicine).  Patients are able to view lab/test results, encounter notes, upcoming appointments, etc.  Non-urgent messages can be sent to your provider as well.   To learn more about what you can do with MyChart, go to [email or web address]  Your next appointment:   4-6 month(s)  The format for your next appointment:   In Person  Provider:   You may see HenSinclair GroomsD or one of the following Advanced Practice Providers on your designated Care Team:   LauCecilie KicksP   Other Instructions

## 2021-01-15 LAB — CUP PACEART REMOTE DEVICE CHECK
Date Time Interrogation Session: 20221012115848
Implantable Pulse Generator Implant Date: 20210315

## 2021-01-21 NOTE — Progress Notes (Signed)
Carelink Summary Report / Loop Recorder 

## 2021-01-27 DIAGNOSIS — Z23 Encounter for immunization: Secondary | ICD-10-CM | POA: Diagnosis not present

## 2021-01-28 DIAGNOSIS — M1612 Unilateral primary osteoarthritis, left hip: Secondary | ICD-10-CM | POA: Diagnosis not present

## 2021-01-28 DIAGNOSIS — M2550 Pain in unspecified joint: Secondary | ICD-10-CM | POA: Diagnosis not present

## 2021-01-28 DIAGNOSIS — M7062 Trochanteric bursitis, left hip: Secondary | ICD-10-CM | POA: Diagnosis not present

## 2021-02-03 DIAGNOSIS — M7062 Trochanteric bursitis, left hip: Secondary | ICD-10-CM | POA: Diagnosis not present

## 2021-02-03 DIAGNOSIS — M25552 Pain in left hip: Secondary | ICD-10-CM | POA: Diagnosis not present

## 2021-02-03 DIAGNOSIS — S76312A Strain of muscle, fascia and tendon of the posterior muscle group at thigh level, left thigh, initial encounter: Secondary | ICD-10-CM | POA: Diagnosis not present

## 2021-02-03 DIAGNOSIS — M1612 Unilateral primary osteoarthritis, left hip: Secondary | ICD-10-CM | POA: Diagnosis not present

## 2021-02-03 DIAGNOSIS — Z90710 Acquired absence of both cervix and uterus: Secondary | ICD-10-CM | POA: Diagnosis not present

## 2021-02-04 DIAGNOSIS — E559 Vitamin D deficiency, unspecified: Secondary | ICD-10-CM | POA: Diagnosis not present

## 2021-02-04 DIAGNOSIS — E785 Hyperlipidemia, unspecified: Secondary | ICD-10-CM | POA: Diagnosis not present

## 2021-02-04 DIAGNOSIS — Z6829 Body mass index (BMI) 29.0-29.9, adult: Secondary | ICD-10-CM | POA: Diagnosis not present

## 2021-02-04 DIAGNOSIS — Z79899 Other long term (current) drug therapy: Secondary | ICD-10-CM | POA: Diagnosis not present

## 2021-02-04 DIAGNOSIS — M2550 Pain in unspecified joint: Secondary | ICD-10-CM | POA: Diagnosis not present

## 2021-02-04 DIAGNOSIS — Z1331 Encounter for screening for depression: Secondary | ICD-10-CM | POA: Diagnosis not present

## 2021-02-04 DIAGNOSIS — E039 Hypothyroidism, unspecified: Secondary | ICD-10-CM | POA: Diagnosis not present

## 2021-02-04 DIAGNOSIS — Z0000 Encounter for general adult medical examination without abnormal findings: Secondary | ICD-10-CM | POA: Diagnosis not present

## 2021-02-04 DIAGNOSIS — I2510 Atherosclerotic heart disease of native coronary artery without angina pectoris: Secondary | ICD-10-CM | POA: Diagnosis not present

## 2021-02-05 DIAGNOSIS — M7062 Trochanteric bursitis, left hip: Secondary | ICD-10-CM | POA: Diagnosis not present

## 2021-02-05 DIAGNOSIS — M1612 Unilateral primary osteoarthritis, left hip: Secondary | ICD-10-CM | POA: Diagnosis not present

## 2021-02-11 DIAGNOSIS — U071 COVID-19: Secondary | ICD-10-CM | POA: Diagnosis not present

## 2021-02-13 ENCOUNTER — Other Ambulatory Visit: Payer: Medicare Other

## 2021-02-13 LAB — CUP PACEART REMOTE DEVICE CHECK
Date Time Interrogation Session: 20221108230419
Implantable Pulse Generator Implant Date: 20210315

## 2021-02-17 ENCOUNTER — Ambulatory Visit (INDEPENDENT_AMBULATORY_CARE_PROVIDER_SITE_OTHER): Payer: Medicare Other

## 2021-02-17 DIAGNOSIS — I480 Paroxysmal atrial fibrillation: Secondary | ICD-10-CM

## 2021-02-24 DIAGNOSIS — Z1231 Encounter for screening mammogram for malignant neoplasm of breast: Secondary | ICD-10-CM | POA: Diagnosis not present

## 2021-02-24 NOTE — Progress Notes (Signed)
Carelink Summary Report / Loop Recorder 

## 2021-03-24 ENCOUNTER — Ambulatory Visit (INDEPENDENT_AMBULATORY_CARE_PROVIDER_SITE_OTHER): Payer: Medicare Other

## 2021-03-24 DIAGNOSIS — I480 Paroxysmal atrial fibrillation: Secondary | ICD-10-CM

## 2021-03-25 LAB — CUP PACEART REMOTE DEVICE CHECK
Date Time Interrogation Session: 20221211231216
Implantable Pulse Generator Implant Date: 20210315

## 2021-04-02 NOTE — Progress Notes (Signed)
Carelink Summary Report / Loop Recorder 

## 2021-04-28 ENCOUNTER — Ambulatory Visit (INDEPENDENT_AMBULATORY_CARE_PROVIDER_SITE_OTHER): Payer: Medicare Other

## 2021-04-28 DIAGNOSIS — I480 Paroxysmal atrial fibrillation: Secondary | ICD-10-CM

## 2021-04-28 LAB — CUP PACEART REMOTE DEVICE CHECK
Date Time Interrogation Session: 20230122230900
Implantable Pulse Generator Implant Date: 20210315

## 2021-05-08 NOTE — Progress Notes (Signed)
Carelink Summary Report / Loop Recorder 

## 2021-05-20 ENCOUNTER — Encounter: Payer: Self-pay | Admitting: Interventional Cardiology

## 2021-05-21 MED ORDER — RANOLAZINE ER 500 MG PO TB12: ORAL_TABLET | ORAL | 11 refills | Status: DC

## 2021-05-21 NOTE — Telephone Encounter (Signed)
Belva Crome, MD  You 23 minutes ago (9:03 AM)   Increase Ranolazine to 1000 mg BID

## 2021-06-01 LAB — CUP PACEART REMOTE DEVICE CHECK
Date Time Interrogation Session: 20230224230612
Implantable Pulse Generator Implant Date: 20210315

## 2021-06-02 ENCOUNTER — Ambulatory Visit (INDEPENDENT_AMBULATORY_CARE_PROVIDER_SITE_OTHER): Payer: Medicare Other

## 2021-06-02 DIAGNOSIS — I480 Paroxysmal atrial fibrillation: Secondary | ICD-10-CM

## 2021-06-05 NOTE — Progress Notes (Signed)
Carelink Summary Report / Loop Recorder 

## 2021-06-09 ENCOUNTER — Encounter: Payer: Self-pay | Admitting: Interventional Cardiology

## 2021-06-11 ENCOUNTER — Encounter: Payer: Self-pay | Admitting: Interventional Cardiology

## 2021-06-20 ENCOUNTER — Other Ambulatory Visit: Payer: Self-pay | Admitting: Internal Medicine

## 2021-07-07 ENCOUNTER — Ambulatory Visit (INDEPENDENT_AMBULATORY_CARE_PROVIDER_SITE_OTHER): Payer: Medicare Other

## 2021-07-07 DIAGNOSIS — I480 Paroxysmal atrial fibrillation: Secondary | ICD-10-CM

## 2021-07-07 LAB — CUP PACEART REMOTE DEVICE CHECK
Date Time Interrogation Session: 20230331230802
Implantable Pulse Generator Implant Date: 20210315

## 2021-07-14 ENCOUNTER — Other Ambulatory Visit: Payer: Self-pay | Admitting: Internal Medicine

## 2021-07-15 ENCOUNTER — Encounter: Payer: Self-pay | Admitting: Internal Medicine

## 2021-07-15 ENCOUNTER — Ambulatory Visit (INDEPENDENT_AMBULATORY_CARE_PROVIDER_SITE_OTHER): Payer: Medicare Other | Admitting: Internal Medicine

## 2021-07-15 VITALS — BP 137/73 | HR 58 | Ht 67.0 in | Wt 180.2 lb

## 2021-07-15 DIAGNOSIS — R002 Palpitations: Secondary | ICD-10-CM

## 2021-07-15 DIAGNOSIS — Z9889 Other specified postprocedural states: Secondary | ICD-10-CM | POA: Diagnosis not present

## 2021-07-15 DIAGNOSIS — I480 Paroxysmal atrial fibrillation: Secondary | ICD-10-CM | POA: Diagnosis not present

## 2021-07-15 MED ORDER — ATORVASTATIN CALCIUM 80 MG PO TABS: 80.0000 mg | ORAL_TABLET | Freq: Every day | ORAL | 3 refills | Status: DC

## 2021-07-15 NOTE — Patient Instructions (Signed)

## 2021-07-15 NOTE — Progress Notes (Signed)
? ? ? ? ?Patient Care Team: ?Ernestene Kiel, MD as PCP - General (Internal Medicine) ?Belva Crome, MD as PCP - Cardiology (Cardiology) ? ? ?HPI ? ?Wanda Freeman is a 71 y.o. female Seen in followup for symptomatic PACs for which we undertook beta blocker trials 7/17 also symptomatic PVCs; subsequently found to have atrial fibrillation-brief (on AliveCor monitor)  and has not yet anticoagulated ? ?Drug doses were targeted low because of a history of hypotension. ? ?Recurrent chest pain, subsequently appreciated to be associate with ventricular ectopy, prompted>> catheterization.  Chest pain has been appreciated to be associated with ventricular ectopy ? ?Episode of presyncope.  Review of the Linq 2 symptomatic episodes, one associated with nonsustained atrial tachycardia and the other with frequent ventricular ectopy as well as atrial ectopy. ?  ?Because of ongoing issues with presyncope and lightheadedness, with her apple watch showing abrupt changes in heart rate, we undertook a implantation of a Linq recorder (3/21) demonstrating nonsustained atrial tachycardia with frequent atrial and ventricular ectopy.  Treated with a combination of bisoprolol and flecainide ? ?Continues with exertional chest pain.  Limiting.  Not only specific activities but also in terms of life as she feels constrained about not going out so as to not to be with friends when she gets chest pain.  Nitroglycerin associated with syncope. ? ?   ? ? ?  ? ?DATE PR interval QRSduration Dose  ?4/21  124 70 0  ?7/21 152 84 50  ?3/22 144 84 50  ?4/23 140 82 50  ? ? ?  ? ?. ?DATE TEST EF    ?7/17 Echo  55-65% %    ?11/20 LHC 55-65% Cors w/o obstruction  ? 12/20 Echo  60-65%    ?8/22 CTA  CaScore 2.9  ? ? ?Thromboembolic risk factors ( age -42, Gender-1) for a CHADSVASc Score of 2 ? ?Date Cr K TSH Hgb  ?1/21    0.523<<0.011    ?5/21 0.77 4.7  14.4 (8/21)  ?8/22 0.72 4.5     ? ?Treated hypothyroidism  ?Her ?Past Medical History:  ?Diagnosis Date   ? Thyroid disease   ? Vitamin D deficiency   ? ? ?Past Surgical History:  ?Procedure Laterality Date  ? LEFT HEART CATH AND CORONARY ANGIOGRAPHY N/A 03/06/2019  ? Procedure: LEFT HEART CATH AND CORONARY ANGIOGRAPHY;  Surgeon: Troy Sine, MD;  Location: [facility];  Service: Cardiovascular;  Laterality: N/A;  ? stab phlebectomy Left 05/25/2016  ? > 20 incisions by Tinnie Gens MD  ? stab phlebectomy Right 06/08/2016  ? stab phlebectomy > 20 incisions right leg by Tinnie Gens MD   ? TOTAL ABDOMINAL HYSTERECTOMY  2019  ? ? ?Current Outpatient Medications  ?Medication Sig Dispense Refill  ? aspirin EC 81 MG tablet Take 81 mg by mouth daily. Swallow whole.    ? atorvastatin (LIPITOR) 80 MG tablet Take 1 tablet (80 mg total) by mouth daily. 90 tablet 3  ? carvedilol (COREG) 12.5 MG tablet Take 1 tablet (12.5 mg total) by mouth 2 (two) times daily. 180 tablet 3  ? estradiol (ESTRACE) 0.1 MG/GM vaginal cream Place 1 Applicatorful vaginally at bedtime.    ? flecainide (TAMBOCOR) 50 MG tablet TAKE 1 TAB BY MOUTH 2 TIMES DAILY. KEEP APPT IN APRIL 2023 BEFORE ANYMORE REFILLS. THANK YOU 60 tablet 11  ? losartan (COZAAR) 25 MG tablet Take 1 tablet (25 mg total) by mouth daily. (Patient taking differently: Take 25 mg by mouth  as needed.) 90 tablet 3  ? nitroGLYCERIN (NITROSTAT) 0.4 MG SL tablet PLACE 1 TABLET UNDER THE TONGUE EVERY 5 MINUTES AS NEEDED FOR CHEST PAIN. 25 tablet 2  ? ranolazine (RANEXA) 500 MG 12 hr tablet Take 2 tablets ('1000mg'$ ) by mouth twice daily 120 tablet 11  ? SYNTHROID 100 MCG tablet Take 100 mcg by mouth daily.    ? Vitamin D, Ergocalciferol, (DRISDOL) 50000 units CAPS capsule Take 50,000 Units by mouth every 7 (seven) days.    ? ?No current facility-administered medications for this visit.  ? ? ?Allergies  ?Allergen Reactions  ? Tizanidine Other (See Comments) and Swelling  ? ? ? ? ?Review of Systems negative except from HPI and PMH ? ?Physical Exam ?BP 137/73   Pulse (!) 58   Ht '5\' 7"'$   (1.702 m)   Wt 180 lb 3.2 oz (81.7 kg)   SpO2 96%   BMI 28.22 kg/m?  ?Well developed and well nourished in no acute distress ?HENT normal ?Neck supple with JVP-flat ?Clear ?Device pocket well healed; without hematoma or erythema.  There is no tethering  ?Regular rate and rhythm, no  gallop No  murmur ?Abd-soft with active BS ?No Clubbing cyanosis  edema ?Skin-warm and dry ?A & Oriented  Grossly normal sensory and motor function ? ?ECG sinus @ 67 ?17/12/42 ?IRBBB ? ? ? Plan ?PAC/PVCs ? ?Atrial fibrillation paroxysmal ? ?Exertional chest discomfort ? ?Lightheadedness-presyncope ? ?Implantable loop recorder (DOI 3/21) ? ?Continues to follow-up with Dr. Tamala Julian about the [facility].  I told her I did not have enough insight to offer any suggestions. ? ?Continue the flecainide. ? ?With her history of lightheadedness and low blood pressure, I do wonder though whether nitrates chronically or calcium blockers might be better than the carvedilol not withstanding as an alpha blockade effect with his beta-blockade effect.  Will defer to Dr. Tamala Julian ? ? ?Virl Axe, MD. ?07/15/2021 6:07 PM ? ? ?  ? ?

## 2021-07-18 NOTE — Progress Notes (Signed)
Carelink Summary Report / Loop Recorder 

## 2021-08-06 DIAGNOSIS — Z23 Encounter for immunization: Secondary | ICD-10-CM | POA: Diagnosis not present

## 2021-08-11 ENCOUNTER — Ambulatory Visit (INDEPENDENT_AMBULATORY_CARE_PROVIDER_SITE_OTHER): Payer: Medicare Other

## 2021-08-11 ENCOUNTER — Telehealth: Payer: Self-pay

## 2021-08-11 DIAGNOSIS — Z9889 Other specified postprocedural states: Secondary | ICD-10-CM

## 2021-08-11 LAB — CUP PACEART REMOTE DEVICE CHECK
Date Time Interrogation Session: 20230507231036
Implantable Pulse Generator Implant Date: 20210315

## 2021-08-11 NOTE — Telephone Encounter (Signed)
Attempted to contact patient to reschedule office visit appt on 08/20/21 for an earlier date due to Dr. Tamala Julian not being available in the afternoon on 5/17. No answer, left message to return call. ?

## 2021-08-18 NOTE — Progress Notes (Signed)
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    History of Present Illness:    Wanda Freeman is a 71 y.o. female with a hx of PAF, symptomatic chest pain with PAC's and PVC's, suppression of ectopy with therapy, and exertional CP with patent epicardial coronary arteries, abnormal ischemic ETT, and hypertensive BP response (08/2020). Referred for consideration of microvascular dysfunction.  Of the therapies that have been tried, the issue has been low blood pressures preventing long-acting nitrates, calcium channel blockers, and angiotensin receptor blockers.  She is not able to tolerate standard dose Ranexa which is 1000 mg twice daily.  She is on 500 mg twice daily and feels as though the discomfort is not as severe or quite as frequent.  The current pain pattern usually involves awakening in the mornings with 20 to 30 seconds of chest discomfort and then radiation of the discomfort into the left arm.  The chest discomfort can resolved but the left arm discomfort continues.  Occasionally physical activity will precipitate a similar type discomfort.  She does not use nitroglycerin for this because of concern about blood pressure lowering.  She was not able to fully engage in cardiac rehab because of musculoskeletal inflammation and also exercise-induced elevations in blood pressure that caused migraine headaches.  She is now back at the [facility] trying to do her own aerobic program.  She states that certain activities cause her chest to be, uncomfortable.  He has done reading on microvascular disease and has multiple questions that we covered.  She brought in the list of medications that have been tried and microvascular disease and is  wondering if we should try all of them to see what works.  I recommended earlier that we should consider ivabradine however she was against this because of its impact on heart rate which tends to run relatively slow.  She continues to come back to the data/mention of metformin as a possible treatment option.  I can find no data on metformin.  Past Medical History:  Diagnosis Date   Thyroid disease    Vitamin D deficiency     Past Surgical History:  Procedure Laterality Date   LEFT HEART CATH AND CORONARY ANGIOGRAPHY N/A 03/06/2019   Procedure: LEFT HEART CATH AND CORONARY ANGIOGRAPHY;  Surgeon: Troy Sine, MD;  Location: [facility];  Service: Cardiovascular;  Laterality: N/A;   stab phlebectomy Left 05/25/2016   > 20 incisions by Tinnie Gens MD   stab phlebectomy Right 06/08/2016   stab phlebectomy > 20 incisions right leg by Tinnie Gens MD    TOTAL ABDOMINAL HYSTERECTOMY  2019    Current Medications: Current Meds  Medication Sig   aspirin EC 81 MG tablet Take 81 mg by mouth daily. Swallow whole.   atorvastatin (LIPITOR) 80 MG tablet Take 1 tablet (80 mg total) by mouth daily.   carvedilol (COREG) 12.5 MG tablet Take 1 tablet (12.5 mg total) by mouth 2 (two) times daily.   estradiol (ESTRACE) 0.1 MG/GM vaginal cream Place 1 Applicatorful vaginally at bedtime.   flecainide (TAMBOCOR) 50 MG tablet TAKE 1 TAB BY MOUTH 2 TIMES DAILY. KEEP APPT IN APRIL 2023 BEFORE ANYMORE REFILLS.  THANK YOU   nitroGLYCERIN (NITROSTAT) 0.4 MG SL tablet PLACE 1 TABLET UNDER THE TONGUE EVERY 5 MINUTES AS NEEDED FOR CHEST PAIN.   ranolazine (RANEXA) 500 MG 12 hr tablet Take 500 mg by mouth 2 (two) times daily. Pt takes 500 mg 1 tablet twice a day.   SYNTHROID 100 MCG tablet Take 100 mcg by mouth daily.   Vitamin D, Ergocalciferol, (DRISDOL) 50000 units CAPS capsule Take 50,000 Units by mouth every 7 (seven) days.   [DISCONTINUED] losartan (COZAAR) 25 MG tablet Take 1 tablet (25 mg total) by  mouth daily. (Patient taking differently: Take 25 mg by mouth as needed.)     Allergies:   Tizanidine   Social History   Socioeconomic History   Marital status: Married    Spouse name: Not on file   Number of children: Not on file   Years of education: Not on file   Highest education level: Not on file  Occupational History   Not on file  Tobacco Use   Smoking status: Never   Smokeless tobacco: Never  Vaping Use   Vaping Use: Never used  Substance and Sexual Activity   Alcohol use: Yes    Comment: occasional   Drug use: No   Sexual activity: Not on file  Other Topics Concern   Not on file  Social History Narrative   Not on file   Social Determinants of Health   Financial Resource Strain: Not on file  Food Insecurity: Not on file  Transportation Needs: Not on file  Physical Activity: Not on file  Stress: Not on file  Social Connections: Not on file     Family History: The patient's Family history is unknown by patient.  ROS:   Please see the history of present illness.    She denies orthopnea.  There is some dyspnea on exertion.  All other systems reviewed and are negative.  EKGs/Labs/Other Studies Reviewed:    The following studies were reviewed today: Coronary calcium score November 20, 2020: IMPRESSION: 1. Calcium score 2.9 which is only 36 th percentile for age/sex   2.  CAD RADS 1 non obstructive coronary artery disease   3.  Normal aortic root 3.1 cm   4.  Possible small PFO   Patient will f/u with Dr Tamala Julian for further w/u and Rx of presumed microvascular angina   Exercise treadmill test Aug 27, 2020:  Study Highlights  Blood pressure demonstrated a hypertensive response to exercise. Downsloping ST segment depression ST segment depression of 2 mm was noted during stress in the II, III, V4, V5, V6 and aVF leads, and returning to baseline after 1-5 minutes of recovery.  EKG:  EKG sinus rhythm at 62 bpm with normal appearance.  When compared to  July 15, 2021, the rate is faster on the current tracing.   2D Doppler echocardiogram 2020: IMPRESSIONS     1. Left ventricular ejection fraction, by visual estimation, is 60 to  65%. The left ventricle has normal function. There is no left ventricular  hypertrophy.   2. The left ventricle has no regional wall motion abnormalities.   3. Global right ventricle has normal systolic function.The right  ventricular size is normal. No increase in right ventricular wall  thickness.   4. Left atrial size was normal.   5. Right atrial size was normal.   6. The mitral valve is normal in structure. Trivial mitral valve  regurgitation. No evidence of mitral stenosis.   7. The tricuspid valve  is normal in structure. Tricuspid valve  regurgitation is trivial.   8. The aortic valve is tricuspid. Aortic valve regurgitation is not  visualized. No evidence of aortic valve sclerosis or stenosis.   9. The pulmonic valve was normal in structure. Pulmonic valve  regurgitation is trivial.  10. Normal pulmonary artery systolic pressure.  11. The inferior vena cava is normal in size with greater than 50%  respiratory variability, suggesting right atrial pressure of 3 mmHg.   Recent Labs: 11/11/2020: BUN 13; Creatinine, Ser 0.72; Potassium 4.5; Sodium 141  Recent Lipid Panel    Component Value Date/Time   CHOL 129 08/08/2019 0905   TRIG 49 08/08/2019 0905   HDL 57 08/08/2019 0905   CHOLHDL 2.3 08/08/2019 0905   LDLCALC 61 08/08/2019 0905    Physical Exam:    VS:  BP 130/72   Pulse 62   Ht '5\' 7"'$  (1.702 m)   Wt 184 lb 12.8 oz (83.8 kg)   SpO2 96%   BMI 28.94 kg/m     Wt Readings from Last 3 Encounters:  08/20/21 184 lb 12.8 oz (83.8 kg)  07/15/21 180 lb 3.2 oz (81.7 kg)  01/13/21 187 lb 6.4 oz (85 kg)     GEN: Overweight. No acute distress HEENT: Normal NECK: No JVD. LYMPHATICS: No lymphadenopathy CARDIAC: No murmur. RRR S4 but no S3 gallop, or edema. VASCULAR:  Normal Pulses. No  bruits. 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)   2. DOE (dyspnea on exertion)   3. Paroxysmal atrial fibrillation ([facility])   4. Hypercholesteremia   5. Premature ventricular contractions    PLAN:    In order of problems listed above:  We discussed a CT PET to confirm the diagnosis presumed microvascular dysfunction.  I explained the process, but we decided to hold off for the time being. 2D Doppler echocardiogram to assess diastolic dysfunction.  If she has such, will consider SGLT2 therapy which may be a backdoor way to test this therapy in her situation.  There is an association between microvascular dysfunction and diastolic dysfunction. No clinical instances of atrial fibrillation. Liver and lipid panel will be obtained today. No complaints.   Plan clinical follow-up in 6 to 12 months.  Additional management changes will be based upon clinical course and data as it arises.  I did make her aware that the diagnosis of microvascular dysfunction is presumptive.  We have not done a specific therapy such as invasive coronary flow reserve or PET perfusion to have confirmation.   Medication Adjustments/Labs and Tests Ordered: Current medicines are reviewed at length with the patient today.  Concerns regarding medicines are outlined above.  Orders Placed This Encounter  Procedures   Comprehensive metabolic panel   Lipid panel   EKG 12-Lead   ECHOCARDIOGRAM COMPLETE   No orders of the defined types were placed in this encounter.   Patient Instructions  Medication Instructions:  Your physician recommends that you continue on your current medications as directed. Please refer to the Current Medication list given to you  today.  *If you need a refill on your cardiac medications before your next appointment, please call your pharmacy*  Lab Work: TODAY: Lipid panel, CMET If you have labs (blood work) drawn today and your tests  are completely normal, you will receive your results only by: MyChart Message (if you have MyChart) OR A paper copy in the mail If you have any lab test that is abnormal or we need to change your treatment, we will call you to review the results.  Testing/Procedures: Your physician has requested that you have an echocardiogram. Echocardiography is a painless test that uses sound waves to create images of your heart. It provides your doctor with information about the size and shape of your heart and how well your heart's chambers and valves are working. This procedure takes approximately one hour. There are no restrictions for this procedure.  Follow-Up: At [facility], you and your health needs are our priority.  As part of our continuing mission to provide you with exceptional heart care, we have created designated Provider Care Teams.  These Care Teams include your primary Cardiologist (physician) and Advanced Practice Providers (APPs -  Physician Assistants and Nurse Practitioners) who all work together to provide you with the care you need, when you need it.  Your next appointment:   9-12 month(s) as needed  The format for your next appointment:   In Person  Provider:   Sinclair Grooms, MD {   Important Information About Sugar         Signed, Sinclair Grooms, MD  08/20/2021 2:31 PM    [place]

## 2021-08-20 ENCOUNTER — Encounter: Payer: Self-pay | Admitting: Interventional Cardiology

## 2021-08-20 ENCOUNTER — Ambulatory Visit (INDEPENDENT_AMBULATORY_CARE_PROVIDER_SITE_OTHER): Payer: Medicare Other | Admitting: Interventional Cardiology

## 2021-08-20 VITALS — BP 130/72 | HR 62 | Ht 67.0 in | Wt 184.8 lb

## 2021-08-20 DIAGNOSIS — I208 Other forms of angina pectoris: Secondary | ICD-10-CM | POA: Diagnosis not present

## 2021-08-20 DIAGNOSIS — I480 Paroxysmal atrial fibrillation: Secondary | ICD-10-CM

## 2021-08-20 DIAGNOSIS — R0609 Other forms of dyspnea: Secondary | ICD-10-CM | POA: Diagnosis not present

## 2021-08-20 DIAGNOSIS — I493 Ventricular premature depolarization: Secondary | ICD-10-CM

## 2021-08-20 DIAGNOSIS — I2089 Other forms of angina pectoris: Secondary | ICD-10-CM

## 2021-08-20 DIAGNOSIS — E7800 Pure hypercholesterolemia, unspecified: Secondary | ICD-10-CM | POA: Diagnosis not present

## 2021-08-20 NOTE — Patient Instructions (Signed)
Medication Instructions:  ?Your physician recommends that you continue on your current medications as directed. Please refer to the Current Medication list given to you today. ? ?*If you need a refill on your cardiac medications before your next appointment, please call your pharmacy* ? ?Lab Work: ?TODAY: Lipid panel, CMET ?If you have labs (blood work) drawn today and your tests are completely normal, you will receive your results only by: ?MyChart Message (if you have MyChart) OR ?A paper copy in the mail ?If you have any lab test that is abnormal or we need to change your treatment, we will call you to review the results. ? ?Testing/Procedures: ?Your physician has requested that you have an echocardiogram. Echocardiography is a painless test that uses sound waves to create images of your heart. It provides your doctor with information about the size and shape of your heart and how well your heart?s chambers and valves are working. This procedure takes approximately one hour. There are no restrictions for this procedure. ? ?Follow-Up: ?At [facility], you and your health needs are our priority.  As part of our continuing mission to provide you with exceptional heart care, we have created designated Provider Care Teams.  These Care Teams include your primary Cardiologist (physician) and Advanced Practice Providers (APPs -  Physician Assistants and Nurse Practitioners) who all work together to provide you with the care you need, when you need it. ? ?Your next appointment:   ?9-12 month(s) as needed ? ?The format for your next appointment:   ?In Person ? ?Provider:   ?Sinclair Grooms, MD { ? ? ?Important Information About Sugar ? ? ? ? ?  ?

## 2021-08-21 LAB — COMPREHENSIVE METABOLIC PANEL
ALT: 17 IU/L (ref 0–32)
AST: 25 IU/L (ref 0–40)
Albumin/Globulin Ratio: 2 (ref 1.2–2.2)
Albumin: 4.7 g/dL (ref 3.7–4.7)
Alkaline Phosphatase: 58 IU/L (ref 44–121)
BUN/Creatinine Ratio: 13 (ref 12–28)
BUN: 10 mg/dL (ref 8–27)
Bilirubin Total: 0.7 mg/dL (ref 0.0–1.2)
CO2: 20 mmol/L (ref 20–29)
Calcium: 9.9 mg/dL (ref 8.7–10.3)
Chloride: 106 mmol/L (ref 96–106)
Creatinine, Ser: 0.77 mg/dL (ref 0.57–1.00)
Globulin, Total: 2.4 g/dL (ref 1.5–4.5)
Glucose: 88 mg/dL (ref 70–99)
Potassium: 4.2 mmol/L (ref 3.5–5.2)
Sodium: 144 mmol/L (ref 134–144)
Total Protein: 7.1 g/dL (ref 6.0–8.5)
eGFR: 82 mL/min/{1.73_m2} (ref >59)

## 2021-08-21 LAB — LIPID PANEL
Chol/HDL Ratio: 2.2 ratio (ref 0.0–4.4)
Cholesterol, Total: 128 mg/dL (ref 100–199)
HDL: 57 mg/dL (ref >39)
LDL Chol Calc (NIH): 56 mg/dL (ref 0–99)
Triglycerides: 74 mg/dL (ref 0–149)
VLDL Cholesterol Cal: 15 mg/dL (ref 5–40)

## 2021-08-26 NOTE — Progress Notes (Signed)
Carelink Summary Report / Loop Recorder 

## 2021-09-02 ENCOUNTER — Encounter: Payer: Self-pay | Admitting: Interventional Cardiology

## 2021-09-04 MED ORDER — METFORMIN HCL ER 500 MG PO TB24: 500.0000 mg | ORAL_TABLET | Freq: Every day | ORAL | 0 refills | Status: DC

## 2021-09-09 ENCOUNTER — Ambulatory Visit (HOSPITAL_COMMUNITY): Payer: Medicare Other | Attending: Internal Medicine

## 2021-09-09 DIAGNOSIS — R0609 Other forms of dyspnea: Secondary | ICD-10-CM | POA: Diagnosis not present

## 2021-09-09 LAB — ECHOCARDIOGRAM COMPLETE
Area-P 1/2: 3.21 cm2
P 1/2 time: 542 msec
S' Lateral: 2.7 cm

## 2021-09-15 ENCOUNTER — Ambulatory Visit (INDEPENDENT_AMBULATORY_CARE_PROVIDER_SITE_OTHER): Payer: Medicare Other

## 2021-09-15 DIAGNOSIS — I480 Paroxysmal atrial fibrillation: Secondary | ICD-10-CM | POA: Diagnosis not present

## 2021-09-16 LAB — CUP PACEART REMOTE DEVICE CHECK
Date Time Interrogation Session: 20230609230252
Implantable Pulse Generator Implant Date: 20210315

## 2021-09-17 MED ORDER — METFORMIN HCL ER 500 MG PO TB24: 1000.0000 mg | ORAL_TABLET | Freq: Every day | ORAL | 3 refills | Status: DC

## 2021-09-17 NOTE — Addendum Note (Signed)
Addended by: Molli Barrows on: 09/17/2021 02:09 PM   Modules accepted: Orders

## 2021-09-26 ENCOUNTER — Other Ambulatory Visit: Payer: Self-pay | Admitting: Interventional Cardiology

## 2021-10-06 NOTE — Progress Notes (Signed)
Carelink Summary Report / Loop Recorder 

## 2021-10-16 LAB — CUP PACEART REMOTE DEVICE CHECK
Date Time Interrogation Session: 20230712231343
Implantable Pulse Generator Implant Date: 20210315

## 2021-10-20 ENCOUNTER — Ambulatory Visit (INDEPENDENT_AMBULATORY_CARE_PROVIDER_SITE_OTHER): Payer: Medicare Other

## 2021-10-20 DIAGNOSIS — R002 Palpitations: Secondary | ICD-10-CM | POA: Diagnosis not present

## 2021-11-20 LAB — CUP PACEART REMOTE DEVICE CHECK
Date Time Interrogation Session: 20230814230738
Implantable Pulse Generator Implant Date: 20210315

## 2021-11-24 ENCOUNTER — Ambulatory Visit (INDEPENDENT_AMBULATORY_CARE_PROVIDER_SITE_OTHER): Payer: Medicare Other

## 2021-11-24 DIAGNOSIS — R002 Palpitations: Secondary | ICD-10-CM

## 2021-11-24 NOTE — Progress Notes (Signed)
Carelink Summary Report / Loop Recorder 

## 2021-12-18 DIAGNOSIS — H524 Presbyopia: Secondary | ICD-10-CM | POA: Diagnosis not present

## 2021-12-18 DIAGNOSIS — H04123 Dry eye syndrome of bilateral lacrimal glands: Secondary | ICD-10-CM | POA: Diagnosis not present

## 2021-12-18 DIAGNOSIS — H2513 Age-related nuclear cataract, bilateral: Secondary | ICD-10-CM | POA: Diagnosis not present

## 2021-12-20 NOTE — Progress Notes (Signed)
Carelink Summary Report / Loop Recorder 

## 2021-12-21 DIAGNOSIS — Z23 Encounter for immunization: Secondary | ICD-10-CM | POA: Diagnosis not present

## 2021-12-22 ENCOUNTER — Ambulatory Visit (INDEPENDENT_AMBULATORY_CARE_PROVIDER_SITE_OTHER): Payer: Medicare Other

## 2021-12-22 DIAGNOSIS — R002 Palpitations: Secondary | ICD-10-CM | POA: Diagnosis not present

## 2021-12-23 LAB — CUP PACEART REMOTE DEVICE CHECK
Date Time Interrogation Session: 20230916231201
Implantable Pulse Generator Implant Date: 20210315

## 2022-01-03 NOTE — Progress Notes (Signed)
Carelink Summary Report / Loop Recorder 

## 2022-01-17 ENCOUNTER — Emergency Department (HOSPITAL_COMMUNITY): Payer: Medicare Other

## 2022-01-17 ENCOUNTER — Other Ambulatory Visit: Payer: Self-pay

## 2022-01-17 ENCOUNTER — Encounter (HOSPITAL_COMMUNITY): Payer: Self-pay | Admitting: *Deleted

## 2022-01-17 ENCOUNTER — Emergency Department (HOSPITAL_COMMUNITY)
Admission: EM | Admit: 2022-01-17 | Discharge: 2022-01-18 | Disposition: A | Discharge status: Home / Self Care | Payer: Medicare Other | Attending: Emergency Medicine | Admitting: Emergency Medicine

## 2022-01-17 DIAGNOSIS — Z7982 Long term (current) use of aspirin: Secondary | ICD-10-CM | POA: Diagnosis not present

## 2022-01-17 DIAGNOSIS — R079 Chest pain, unspecified: Secondary | ICD-10-CM

## 2022-01-17 DIAGNOSIS — R0789 Other chest pain: Secondary | ICD-10-CM | POA: Insufficient documentation

## 2022-01-17 HISTORY — DX: Supraventricular tachycardia, unspecified: I47.10

## 2022-01-17 LAB — CBC WITH DIFFERENTIAL/PLATELET
Abs Immature Granulocytes: 0.01 10*3/uL (ref 0.00–0.07)
Basophils Absolute: 0.1 10*3/uL (ref 0.0–0.1)
Basophils Relative: 1 %
Eosinophils Absolute: 0.2 10*3/uL (ref 0.0–0.5)
Eosinophils Relative: 4 %
HCT: 37.5 % (ref 36.0–46.0)
Hemoglobin: 12.9 g/dL (ref 12.0–15.0)
Immature Granulocytes: 0 %
Lymphocytes Relative: 24 %
Lymphs Abs: 1.5 10*3/uL (ref 0.7–4.0)
MCH: 33.2 pg (ref 26.0–34.0)
MCHC: 34.4 g/dL (ref 30.0–36.0)
MCV: 96.4 fL (ref 80.0–100.0)
Monocytes Absolute: 0.8 10*3/uL (ref 0.1–1.0)
Monocytes Relative: 13 %
Neutro Abs: 3.6 10*3/uL (ref 1.7–7.7)
Neutrophils Relative %: 58 %
Platelets: 203 10*3/uL (ref 150–400)
RBC: 3.89 MIL/uL (ref 3.87–5.11)
RDW: 12.3 % (ref 11.5–15.5)
WBC: 6.3 10*3/uL (ref 4.0–10.5)
nRBC: 0 % (ref 0.0–0.2)

## 2022-01-17 LAB — COMPREHENSIVE METABOLIC PANEL
ALT: 18 U/L (ref 0–44)
AST: 23 U/L (ref 15–41)
Albumin: 4.2 g/dL (ref 3.5–5.0)
Alkaline Phosphatase: 43 U/L (ref 38–126)
Anion gap: 8 (ref 5–15)
BUN: 13 mg/dL (ref 8–23)
CO2: 24 mmol/L (ref 22–32)
Calcium: 10.3 mg/dL (ref 8.9–10.3)
Chloride: 108 mmol/L (ref 98–111)
Creatinine, Ser: 0.76 mg/dL (ref 0.44–1.00)
GFR, Estimated: >60 mL/min (ref >60)
Glucose, Bld: 109 mg/dL — ABNORMAL HIGH (ref 70–99)
Potassium: 3.9 mmol/L (ref 3.5–5.1)
Sodium: 140 mmol/L (ref 135–145)
Total Bilirubin: 1 mg/dL (ref 0.3–1.2)
Total Protein: 6.7 g/dL (ref 6.5–8.1)

## 2022-01-17 LAB — TROPONIN I (HIGH SENSITIVITY): Troponin I (High Sensitivity): 3 ng/L (ref <18)

## 2022-01-17 NOTE — ED Provider Triage Note (Signed)
Emergency Medicine Provider Triage Evaluation Note  Wanda Freeman , a 71 y.o. female  was evaluated in triage.  Pt complains of left-sided chest pain started around 7 PM tonight.  She was not doing anything at the time, nonexertional chest pain.  No associated symptoms such as nausea vomitin diaphoresis or the pain.  Pain was described as strong squeezing sensation followed by 2 to 4 minutes of dull aching pain that would then resolve and then recur constantly since 7 PM.  Not improved with 4 sublingual nitroglycerin at home.  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  Review of Systems  Positive: As above Negative: "  Physical Exam  BP (!) 123/55 (BP Location: Left Arm)   Pulse 66   Temp 97.9 F (36.6 C)   Resp 18   SpO2 94%  Gen:   Awake, no distress   Resp:  Normal effort  MSK:   Moves extremities without difficulty  Other:  RRR no M/G.  Lungs CTA B.  Normal neurovascular status in all extremities.  Medical Decision Making  Medically screening exam initiated at 10:40 PM.  Appropriate orders placed.  Elainah Rhyne Barrie was informed that the remainder of the evaluation will be completed by another provider, this initial triage assessment does not replace that evaluation, and the importance of remaining in the ED until their evaluation is complete.  EKG without STEMI. This chart was dictated using voice recognition software, Dragon. Despite the best efforts of this provider to proofread and correct errors, errors may still occur which can change documentation meaning.    Wanda Darling, PA-C 01/17/22 2242

## 2022-01-17 NOTE — ED Triage Notes (Signed)
Pt is here with CP and spasms that started at 7p. Denies N, SOB, Diaphoresis, or back pain.  Pt has had 4 ntg SL since 7p. Pt has history of microvascular disease and has PSVT treated with Flecainide.  Pt last had COVID in August.

## 2022-01-18 ENCOUNTER — Telehealth (HOSPITAL_COMMUNITY): Payer: Self-pay | Admitting: Emergency Medicine

## 2022-01-18 DIAGNOSIS — R0789 Other chest pain: Secondary | ICD-10-CM | POA: Diagnosis not present

## 2022-01-18 LAB — TROPONIN I (HIGH SENSITIVITY): Troponin I (High Sensitivity): 4 ng/L (ref <18)

## 2022-01-18 LAB — LIPASE, BLOOD: Lipase: 33 U/L (ref 11–51)

## 2022-01-18 LAB — D-DIMER, QUANTITATIVE: D-Dimer, Quant: <0.27 ug/mL-FEU (ref 0.00–0.50)

## 2022-01-18 MED ORDER — ALUM & MAG HYDROXIDE-SIMETH 200-200-20 MG/5ML PO SUSP
30.0000 mL | Freq: Once | ORAL | Status: AC
  Administered 2022-01-18: 30 mL via ORAL
  Filled 2022-01-18: qty 30

## 2022-01-18 MED ORDER — LIDOCAINE VISCOUS HCL 2 % MT SOLN
15.0000 mL | Freq: Once | OROMUCOSAL | Status: AC
  Administered 2022-01-18: 15 mL via ORAL
  Filled 2022-01-18: qty 15

## 2022-01-18 MED ORDER — FENTANYL CITRATE PF 50 MCG/ML IJ SOSY
50.0000 ug | PREFILLED_SYRINGE | Freq: Once | INTRAMUSCULAR | Status: AC
  Administered 2022-01-18: 50 ug via INTRAVENOUS
  Filled 2022-01-18: qty 1

## 2022-01-18 MED ORDER — KETOROLAC TROMETHAMINE 30 MG/ML IJ SOLN
15.0000 mg | Freq: Once | INTRAMUSCULAR | Status: AC
  Administered 2022-01-18: 15 mg via INTRAVENOUS
  Filled 2022-01-18: qty 1

## 2022-01-18 MED ORDER — ALPRAZOLAM 0.25 MG PO TABS: 0.2500 mg | ORAL_TABLET | Freq: Every evening | ORAL | 0 refills | Status: DC | PRN

## 2022-01-18 MED ORDER — KETOROLAC TROMETHAMINE 10 MG PO TABS: 10.0000 mg | ORAL_TABLET | Freq: Four times a day (QID) | ORAL | 0 refills | Status: DC | PRN

## 2022-01-18 NOTE — Telephone Encounter (Signed)
Encounter was initially created to attempt to prescribe medications however IT figured out the problem. No telephone encounter exists for this patient from me at this time.

## 2022-01-18 NOTE — Telephone Encounter (Deleted)
Encounter created to try and order medications as it is not working on AVS.

## 2022-01-18 NOTE — ED Provider Notes (Signed)
[facility] EMERGENCY DEPARTMENT Provider Note   CSN: [id number] Arrival date & time: 01/17/22  2145     History  Chief Complaint  Patient presents with   Chest Pain    Wanda Freeman is a 71 y.o. female.  71 yo F here with chest pain. H/o microvascular ischemia on metformin (experimental treatment) for same. Often has CP intermittently but usually improves with NTG. Tonight she was getting ready to make supper when she started having some left sided chest discomfort. Had intermittent episodes of sharp shooting pain over next couple hours and in between started having some duller pain. NTG did not help. Not exertional. Not related to eating. No nausea, vomiting, diaphoresis, dyspnea or other associated symptoms. No recent illnesses.    Chest Pain      Home Medications Prior to Admission medications   Medication Sig Start Date End Date Taking? Authorizing Provider  ALPRAZolam (XANAX) 0.25 MG tablet Take 1 tablet (0.25 mg total) by mouth at bedtime as needed for anxiety. 01/18/22  Yes Abbagayle Zaragoza, Corene Cornea, MD  aspirin EC 81 MG tablet Take 81 mg by mouth daily. Swallow whole.   Yes [provider]  atorvastatin (LIPITOR) 80 MG tablet Take 1 tablet (80 mg total) by mouth daily. 07/15/21  Yes Deboraha Sprang, MD  carvedilol (COREG) 12.5 MG tablet Take 1 tablet (12.5 mg total) by mouth 2 (two) times daily. 12/16/20  Yes Belva Crome, MD  estradiol (ESTRACE) 0.1 MG/GM vaginal cream Place 1 Applicatorful vaginally at bedtime. 09/16/19  Yes [provider]  flecainide (TAMBOCOR) 50 MG tablet TAKE 1 TAB BY MOUTH 2 TIMES DAILY. KEEP APPT IN APRIL 2023 BEFORE ANYMORE REFILLS. Arab YOU Patient taking differently: Take 50 mg by mouth 2 (two) times daily. 07/14/21  Yes Belva Crome, MD  ketorolac (TORADOL) 10 MG tablet Take 1 tablet (10 mg total) by mouth every 6 (six) hours as needed. 01/18/22  Yes Samentha Perham, Corene Cornea, MD  metFORMIN (GLUCOPHAGE-XR) 500 MG 24 hr tablet  Take 2 tablets (1,000 mg total) by mouth daily with breakfast. 09/17/21  Yes Belva Crome, MD  nitroGLYCERIN (NITROSTAT) 0.4 MG SL tablet PLACE 1 TABLET UNDER THE TONGUE EVERY 5 MINUTES AS NEEDED FOR CHEST PAIN. Patient taking differently: Place 0.4 mg under the tongue every 5 (five) minutes as needed for chest pain. 12/05/20  Yes Deboraha Sprang, MD  ranolazine (RANEXA) 500 MG 12 hr tablet Take 500 mg by mouth 2 (two) times daily. Pt takes 500 mg 1 tablet twice a day.   Yes [provider]  SYNTHROID 100 MCG tablet Take 100 mcg by mouth daily. 03/15/19  Yes [provider]  Vitamin D, Ergocalciferol, (DRISDOL) 50000 units CAPS capsule Take 50,000 Units by mouth every 7 (seven) days. Sunday   Yes [provider]      Allergies    Tizanidine    Review of Systems   Review of Systems  Cardiovascular:  Positive for chest pain.    Physical Exam Updated Vital Signs BP (!) 128/57   Pulse 64   Temp (!) 97.1 F (36.2 C) (Oral)   Resp (!) 22   SpO2 97%  Physical Exam Vitals and nursing note reviewed.  Constitutional:      Appearance: She is well-developed.  HENT:     Head: Normocephalic and atraumatic.  Cardiovascular:     Rate and Rhythm: Normal rate and regular rhythm.  Pulmonary:     Effort: Pulmonary effort is normal.  No respiratory distress.     Breath sounds: No stridor.  Chest:     Chest wall: No deformity, tenderness or crepitus.  Abdominal:     General: There is no distension.  Musculoskeletal:     Cervical back: Normal range of motion.  Skin:    General: Skin is warm and dry.  Neurological:     Mental Status: She is alert.     ED Results / Procedures / Treatments   Labs (all labs ordered are listed, but only abnormal results are displayed) Labs Reviewed  COMPREHENSIVE METABOLIC PANEL - Abnormal; Notable for the following components:      Result Value   Glucose, Bld 109 (*)    All other components within normal limits  CBC WITH  DIFFERENTIAL/PLATELET  LIPASE, BLOOD  D-DIMER, QUANTITATIVE  TROPONIN I (HIGH SENSITIVITY)  TROPONIN I (HIGH SENSITIVITY)    EKG EKG Interpretation  Date/Time:  Saturday January 17 2022 22:16:26 EDT Ventricular Rate:  68 PR Interval:  152 QRS Duration: 86 QT Interval:  410 QTC Calculation: 435 R Axis:   76 Text Interpretation: Normal sinus rhythm Cannot rule out Anterior infarct , age undetermined Abnormal ECG No previous ECGs available Confirmed by Merrily Pew [phone]) on 01/17/2022 11:50:43 PM  Radiology DG Chest 2 View  Result Date: 01/17/2022 CLINICAL DATA:  Chest pain EXAM: CHEST - 2 VIEW COMPARISON:  03/05/2019 FINDINGS: Lungs are clear.  No pleural effusion or pneumothorax. The heart is normal in size. Mild degenerative changes of the visualized thoracolumbar spine. IMPRESSION: Normal chest radiographs. Electronically Signed   By: Julian Hy M.D.   On: 01/17/2022 23:13    Procedures Procedures    Medications Ordered in ED Medications  fentaNYL (SUBLIMAZE) injection 50 mcg (50 mcg Intravenous Given 01/18/22 0136)  ketorolac (TORADOL) 30 MG/ML injection 15 mg (15 mg Intravenous Given 01/18/22 0306)  alum & mag hydroxide-simeth (MAALOX/MYLANTA) 200-200-20 MG/5ML suspension 30 mL (30 mLs Oral Given 01/18/22 0306)    And  lidocaine (XYLOCAINE) 2 % viscous mouth solution 15 mL (15 mLs Oral Given 01/18/22 0306)    ED Course/ Medical Decision Making/ A&P                           Medical Decision Making Amount and/or Complexity of Data Reviewed Independent Historian: spouse Labs: ordered.    Details: Troponins negative D dimer reassuring Lipase reassuring Hepatobiliary labs reassuring Radiology: ordered and independent interpretation performed.    Details: CXR without obvious PTX, PNA or other causes ECG/medicine tests: ordered and independent interpretation performed.    Details: NSR without ST changes  Risk OTC drugs. Prescription drug  management. Parenteral controlled substances. Decision regarding hospitalization.  Atypical sounding chest pain.  Does not really sound GI in source as well recently had a long airplane ride from [place] so D-dimer added but this was negative making PE unlikely.  No evidence of pneumonia based on history or x-ray.  EKG and troponins are flat making ACS unlikely however still having chest discomfort and wants to try to get that better before leaving.  Toradol seems to be helping, GI cocktail did not.  Will reassess for disposition shortly. Pain resolved. Requesting xanax and toradol at home. Rx provided. Will fu w/ cards for same.      Final Clinical Impression(s) / ED Diagnoses Final diagnoses:  Nonspecific chest pain    Rx / DC Orders ED Discharge Orders  Ordered    ketorolac (TORADOL) 10 MG tablet  Every 6 hours PRN        01/18/22 0501    ALPRAZolam (XANAX) 0.25 MG tablet  At bedtime PRN        01/18/22 0501              Kenden Brandt, Corene Cornea, MD 01/18/22 [phone]

## 2022-01-18 NOTE — ED Notes (Signed)
ED Provider at bedside. 

## 2022-01-20 ENCOUNTER — Telehealth: Payer: Self-pay

## 2022-01-20 MED ORDER — METFORMIN HCL ER 500 MG PO TB24: 500.0000 mg | ORAL_TABLET | Freq: Every day | ORAL | Status: DC

## 2022-01-20 NOTE — Telephone Encounter (Signed)
Left message for patient informing her of Dr. Thompson Caul comment regarding her recent ED visit.  Per Dr. Tamala Julian: Please let her know that I saw the ER visit and looked through all the data.  All heart studies looked normal.  No new recommendations.   Provided office number for callback if any questions.  Med list updated with metformin '500mg'$  QD as patient is unable to tolerate BID dosing.

## 2022-01-23 ENCOUNTER — Ambulatory Visit (INDEPENDENT_AMBULATORY_CARE_PROVIDER_SITE_OTHER): Payer: Medicare Other

## 2022-01-23 DIAGNOSIS — Z9889 Other specified postprocedural states: Secondary | ICD-10-CM

## 2022-01-23 LAB — CUP PACEART REMOTE DEVICE CHECK
Date Time Interrogation Session: 20231019231036
Implantable Pulse Generator Implant Date: 20210315

## 2022-01-29 NOTE — Progress Notes (Signed)
Carelink Summary Report / Loop Recorder 

## 2022-02-16 DIAGNOSIS — Z23 Encounter for immunization: Secondary | ICD-10-CM | POA: Diagnosis not present

## 2022-02-25 ENCOUNTER — Ambulatory Visit (INDEPENDENT_AMBULATORY_CARE_PROVIDER_SITE_OTHER): Payer: Medicare Other

## 2022-02-25 DIAGNOSIS — I480 Paroxysmal atrial fibrillation: Secondary | ICD-10-CM | POA: Diagnosis not present

## 2022-02-25 LAB — CUP PACEART REMOTE DEVICE CHECK
Date Time Interrogation Session: 20231121231332
Implantable Pulse Generator Implant Date: 20210315

## 2022-03-06 DIAGNOSIS — Z1231 Encounter for screening mammogram for malignant neoplasm of breast: Secondary | ICD-10-CM | POA: Diagnosis not present

## 2022-03-07 ENCOUNTER — Other Ambulatory Visit: Payer: Self-pay | Admitting: Interventional Cardiology

## 2022-03-18 NOTE — Progress Notes (Signed)
Carelink Summary Report / Loop Recorder 

## 2022-03-31 ENCOUNTER — Ambulatory Visit (INDEPENDENT_AMBULATORY_CARE_PROVIDER_SITE_OTHER): Payer: Medicare Other

## 2022-03-31 DIAGNOSIS — I480 Paroxysmal atrial fibrillation: Secondary | ICD-10-CM | POA: Diagnosis not present

## 2022-03-31 LAB — CUP PACEART REMOTE DEVICE CHECK
Date Time Interrogation Session: 20231224231204
Implantable Pulse Generator Implant Date: 20210315

## 2022-04-23 NOTE — Progress Notes (Signed)
[facility] Summary Report / Loop Recorder

## 2022-05-04 ENCOUNTER — Ambulatory Visit: Payer: Medicare Other | Attending: Internal Medicine

## 2022-05-04 DIAGNOSIS — R002 Palpitations: Secondary | ICD-10-CM

## 2022-05-05 LAB — CUP PACEART REMOTE DEVICE CHECK
Date Time Interrogation Session: 20240126230646
Implantable Pulse Generator Implant Date: 20210315

## 2022-05-22 DIAGNOSIS — M85852 Other specified disorders of bone density and structure, left thigh: Secondary | ICD-10-CM | POA: Diagnosis not present

## 2022-05-22 DIAGNOSIS — M85851 Other specified disorders of bone density and structure, right thigh: Secondary | ICD-10-CM | POA: Diagnosis not present

## 2022-05-22 DIAGNOSIS — Z780 Asymptomatic menopausal state: Secondary | ICD-10-CM | POA: Diagnosis not present

## 2022-06-03 ENCOUNTER — Encounter: Payer: Self-pay | Admitting: Internal Medicine

## 2022-06-08 ENCOUNTER — Ambulatory Visit (INDEPENDENT_AMBULATORY_CARE_PROVIDER_SITE_OTHER): Payer: Medicare Other

## 2022-06-08 DIAGNOSIS — R002 Palpitations: Secondary | ICD-10-CM

## 2022-06-08 LAB — CUP PACEART REMOTE DEVICE CHECK
Date Time Interrogation Session: 20240303231313
Implantable Pulse Generator Implant Date: 20210315

## 2022-06-10 MED ORDER — LOSARTAN POTASSIUM 25 MG PO TABS: 25.0000 mg | ORAL_TABLET | Freq: Every day | ORAL | 3 refills | Status: DC

## 2022-06-10 NOTE — Addendum Note (Signed)
Addended by: Molli Barrows on: 06/10/2022 09:15 AM   Modules accepted: Orders

## 2022-06-16 NOTE — Progress Notes (Signed)
Carelink Summary Report / Loop Recorder 

## 2022-07-10 IMAGING — CT CT HEART MORP W/ CTA COR W/ SCORE W/ CA W/CM &/OR W/O CM
4 of 7 series · 8 of 20 positions shown, 9 images · IV contrast (APPLIED)
Comparison: 03/05/2019 chest radiograph from [REDACTED]

Addendum:
CLINICAL DATA: Chest pain

EXAM:
Cardiac CTA
MEDICATIONS:
Sub lingual nitro. 4mg and coreg 12.5 mg
TECHNIQUE: The patient was scanned on a Siemens Force [REDACTED]ice scanner. Gantry
rotation speed was 250 msecs. Collimation was .6 mm. A 100 kV
prospective scan was triggered in the ascending thoracic aorta at
140 HU's Full mA was used between 35% and 75% of the R-R interval.
Average HR during the scan was 58 bpm. The 3D data set was
interpreted on a dedicated work station using MPR, MIP and VRT
modes. A total of 80cc of contrast was used.

[Series 6: best diast 73 % · axial · 0.39mm/px · z∈[+1227,+1260]mm · 2 of 251 slices shown, 3 images]
[im 84/251  vessel]
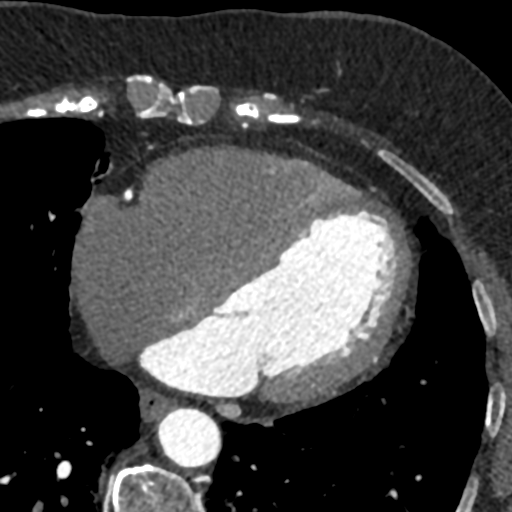
[im 84/251  lung]
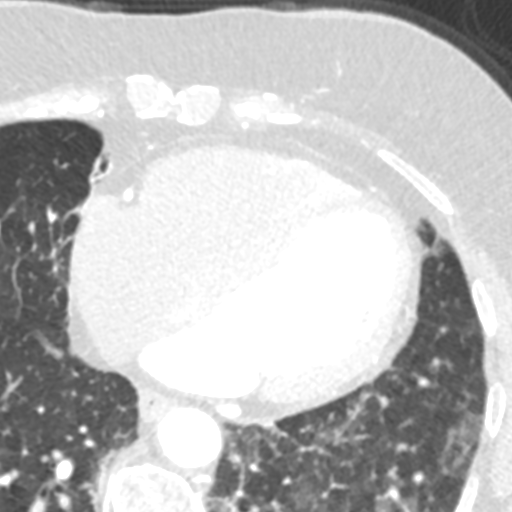
[im 167/251  vessel]
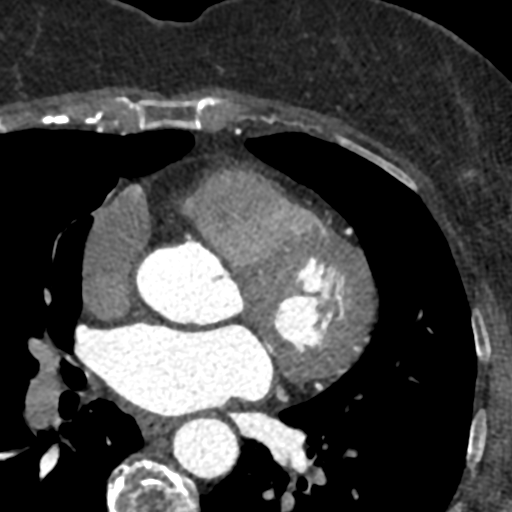

[Series 7: ts diast sharp 73 % · axial · 0.39mm/px · z∈[+1227,+1260]mm · 2 of 251 slices shown]
[im 84/251  lung]
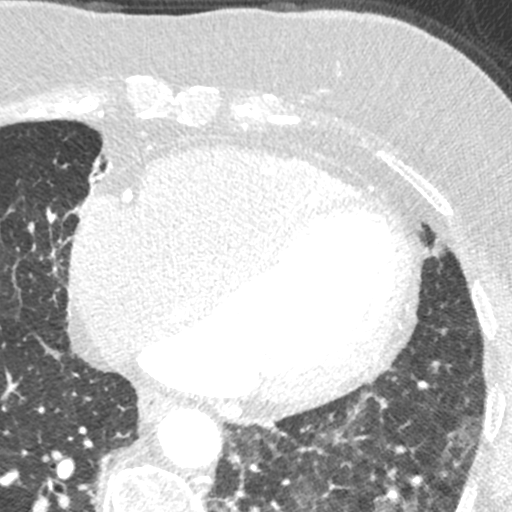
[im 167/251  lung]
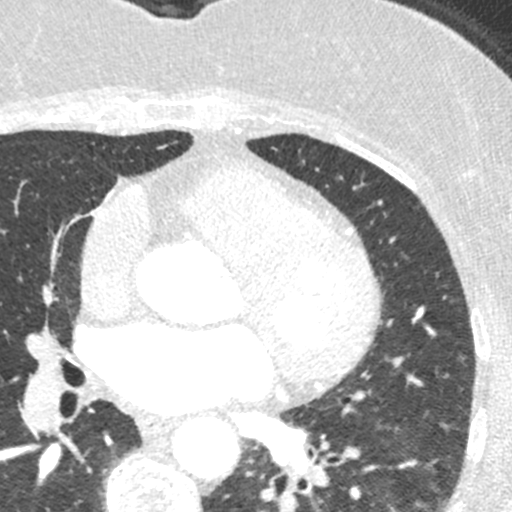

[Series 8: best syst 37 % · axial · 0.39mm/px · z∈[+1227,+1260]mm · 2 of 251 slices shown]
[im 84/251  vessel]
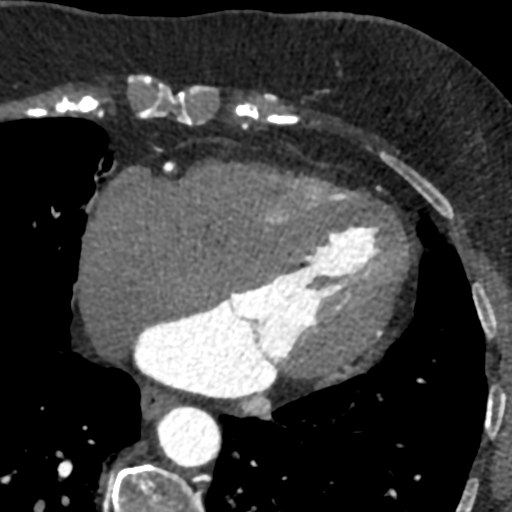
[im 167/251  vessel]
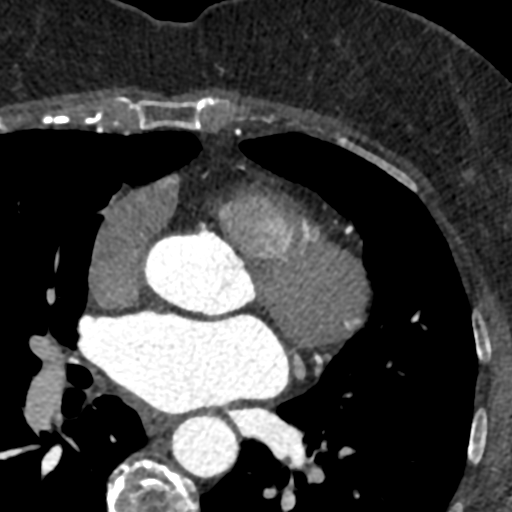

[Series 9: ts syst sharp 37 % · axial · 0.39mm/px · z∈[+1227,+1260]mm · 2 of 251 slices shown]
[im 84/251  lung]
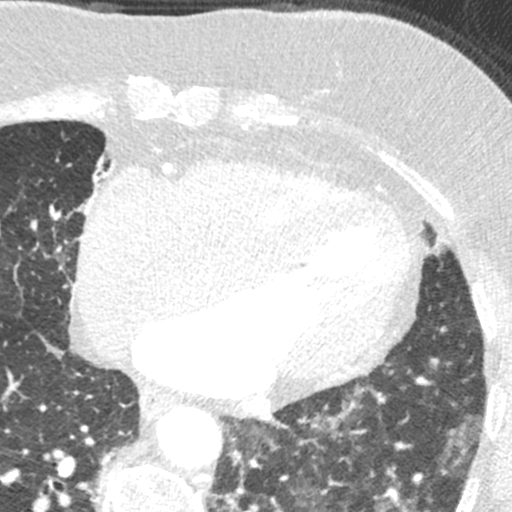
[im 167/251  lung]
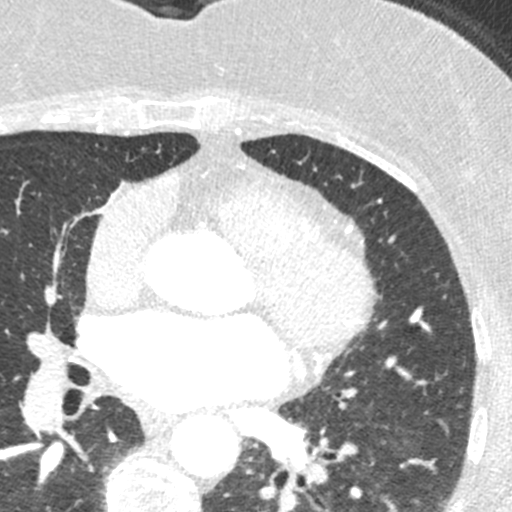

[8 of 20 positions shown; findings below may reference images not displayed]

FINDINGS: Non-cardiac: See separate report from [REDACTED]. No
significant findings on limited lung and soft tissue windows.

Calcium Score: Minimal calcium noted in proximal LAD/Circumflex and
distal RCA

Coronary Arteries: Right dominant with no anomalies

LM: Normal

LAD: 1-24% calcified plaque proximally

IM: Large vessel normal

D1: Normal small

D2: Normal small

Circumflex: 1-24% calcified plaque proximally

OM1: Normal

OM2: Normal

RCA: 1-24% calcified plaque distally

PDA: Normal

PLA: Normal
IMPRESSION: 1. Calcium score 2.9 which is only 36 th percentile for age/sex

2.  CAD RADS 1 non obstructive coronary artery disease

3.  Normal aortic root 3.1 cm

4.  Possible small PFO

Patient will f/u with Dr Amazigh for further w/u and Bonincontro of presumed
microvascular angina

Ambika Alicia

EXAM:
OVER-READ INTERPRETATION  CT CHEST

The following report is an over-read performed by radiologist Dr.
Pearline Dunne [REDACTED] on 11/21/2020. This over-read
does not include interpretation of cardiac or coronary anatomy or
pathology. The coronary CTA interpretation by the cardiologist is
attached.
FINDINGS: Vascular: Aortic atherosclerosis. No central pulmonary embolism, on
this non-dedicated study.

Mediastinum/Nodes: No imaged thoracic adenopathy.

Lungs/Pleura: No pleural fluid.  Mild bibasilar scarring.

Upper Abdomen: Normal imaged portions of the liver, spleen, stomach.

Musculoskeletal: Mild osteopenia.  Lower thoracic spondylosis.
IMPRESSION: No acute findings in the imaged extracardiac chest.

Aortic Atherosclerosis ([id number]).

*** End of Addendum ***
FINDINGS: Non-cardiac: See separate report from [REDACTED]. No
significant findings on limited lung and soft tissue windows.

Calcium Score: Minimal calcium noted in proximal LAD/Circumflex and
distal RCA

Coronary Arteries: Right dominant with no anomalies

LM: Normal

LAD: 1-24% calcified plaque proximally

IM: Large vessel normal

D1: Normal small

D2: Normal small

Circumflex: 1-24% calcified plaque proximally

OM1: Normal

OM2: Normal

RCA: 1-24% calcified plaque distally

PDA: Normal

PLA: Normal
IMPRESSION: 1. Calcium score 2.9 which is only 36 th percentile for age/sex

2.  CAD RADS 1 non obstructive coronary artery disease

3.  Normal aortic root 3.1 cm

4.  Possible small PFO

Patient will f/u with Dr Sayed Moheb for further w/u and Amazigh of presumed
microvascular angina

Bonincontro

## 2022-07-13 ENCOUNTER — Encounter: Payer: Self-pay | Admitting: Cardiovascular Disease

## 2022-07-13 ENCOUNTER — Ambulatory Visit: Payer: Medicare Other | Attending: Cardiovascular Disease | Admitting: Cardiovascular Disease

## 2022-07-13 ENCOUNTER — Ambulatory Visit (INDEPENDENT_AMBULATORY_CARE_PROVIDER_SITE_OTHER): Payer: Medicare Other

## 2022-07-13 VITALS — BP 104/62 | HR 68 | Ht 67.0 in | Wt 168.2 lb

## 2022-07-13 DIAGNOSIS — I2089 Other forms of angina pectoris: Secondary | ICD-10-CM | POA: Insufficient documentation

## 2022-07-13 DIAGNOSIS — R002 Palpitations: Secondary | ICD-10-CM

## 2022-07-13 LAB — CUP PACEART REMOTE DEVICE CHECK
Date Time Interrogation Session: 20240405230622
Implantable Pulse Generator Implant Date: 20210315

## 2022-07-13 MED ORDER — CARVEDILOL 6.25 MG PO TABS: 6.2500 mg | ORAL_TABLET | Freq: Two times a day (BID) | ORAL | 3 refills | Status: DC

## 2022-07-13 NOTE — Progress Notes (Signed)
Cardiology Office Note:    Date:  07/13/2022   ID:  Wanda Freeman, DOB 12-04-1950, MRN [id number]  PCP:  Philemon Kingdom, MD   [facility] Providers Cardiologist:  Lesleigh Noe, MD (Inactive)     Referring MD: Philemon Kingdom, MD   Chief Complaint  Patient presents with   Chest Pain    History of Present Illness:    Wanda Freeman is a 72 y.o. female with a hx of chest pain and coronary microvascular dysfunction, presenting for follow-up evaluation.  The patient has been followed by Dr. Katrinka Blazing, and I will be assuming her care and his retirement.  She presented with chest pain in 2020 and was found to have minimal epicardial CAD.  She has continued to struggle with chest discomfort and she underwent an exercise treadmill study demonstrating downsloping 2 mm ST segment depression.  A gated coronary CTA again confirmed no significant epicardial CAD.  She has been diagnosed with coronary microvascular dysfunction based on her typical symptoms of chest discomfort, abnormal exercise EKG, and no significant epicardial CAD on diagnostic imaging with cardiac catheterization or coronary CTA.  She was not able to tolerate calcium channel blockade or long-acting nitrates.  She did not have much benefit of Ranexa and she discontinued this due to constipation.  She has chest pain at rest and with walking that she describes as a left-sided pressure-like sensation sometimes radiating down the left arm.  She is currently taking low-dose losartan and carvedilol.  She has not taken metformin and has had a modest response with this.  She complains of fatigue.  No significant shortness of breath, orthopnea, PND, or leg edema.  Past Medical History:  Diagnosis Date   PSVT (paroxysmal supraventricular tachycardia)    Thyroid disease    Vitamin D deficiency     Past Surgical History:  Procedure Laterality Date   LEFT HEART CATH AND CORONARY ANGIOGRAPHY N/A 03/06/2019   Procedure: LEFT HEART  CATH AND CORONARY ANGIOGRAPHY;  Surgeon: Lennette Bihari, MD;  Location: [facility];  Service: Cardiovascular;  Laterality: N/A;   stab phlebectomy Left 05/25/2016   > 20 incisions by Josephina Gip MD   stab phlebectomy Right 06/08/2016   stab phlebectomy > 20 incisions right leg by Josephina Gip MD    TOTAL ABDOMINAL HYSTERECTOMY  2019    Current Medications: Current Meds  Medication Sig   ALPRAZolam (XANAX) 0.25 MG tablet Take 1 tablet (0.25 mg total) by mouth at bedtime as needed for anxiety.   aspirin EC 81 MG tablet Take 81 mg by mouth daily. Swallow whole.   atorvastatin (LIPITOR) 80 MG tablet Take 1 tablet (80 mg total) by mouth daily.   carvedilol (COREG) 6.25 MG tablet Take 1 tablet (6.25 mg total) by mouth 2 (two) times daily.   estradiol (ESTRACE) 0.1 MG/GM vaginal cream Place 1 Applicatorful vaginally at bedtime.   flecainide (TAMBOCOR) 50 MG tablet TAKE 1 TAB BY MOUTH 2 TIMES DAILY. KEEP APPT IN APRIL 2023 BEFORE ANYMORE REFILLS. THANK YOU (Patient taking differently: Take 50 mg by mouth 2 (two) times daily.)   ketorolac (TORADOL) 10 MG tablet Take 1 tablet (10 mg total) by mouth every 6 (six) hours as needed.   metFORMIN (GLUCOPHAGE-XR) 500 MG 24 hr tablet Take 1 tablet (500 mg total) by mouth daily with breakfast. (Patient taking differently: Take 500 mg by mouth daily with breakfast. Per patient 2 tablets daily=1,000 mg)   nitroGLYCERIN (NITROSTAT) 0.4 MG SL  tablet PLACE 1 TABLET UNDER THE TONGUE EVERY 5 MINUTES AS NEEDED FOR CHEST PAIN. (Patient taking differently: Place 0.4 mg under the tongue every 5 (five) minutes as needed for chest pain.)   SYNTHROID 100 MCG tablet Take 100 mcg by mouth daily.   Vitamin D, Ergocalciferol, (DRISDOL) 50000 units CAPS capsule Take 50,000 Units by mouth every 7 (seven) days. Sunday   [DISCONTINUED] carvedilol (COREG) 12.5 MG tablet Take 1 tablet (12.5 mg total) by mouth 2 (two) times daily.   [DISCONTINUED] losartan (COZAAR) 25 MG  tablet Take 1 tablet (25 mg total) by mouth daily.     Allergies:   Tizanidine   Social History   Socioeconomic History   Marital status: Married    Spouse name: Not on file   Number of children: Not on file   Years of education: Not on file   Highest education level: Not on file  Occupational History   Not on file  Tobacco Use   Smoking status: Never   Smokeless tobacco: Never  Vaping Use   Vaping Use: Never used  Substance and Sexual Activity   Alcohol use: Yes    Comment: occasional   Drug use: No   Sexual activity: Not on file  Other Topics Concern   Not on file  Social History Narrative   Not on file   Social Determinants of Health   Financial Resource Strain: Not on file  Food Insecurity: Not on file  Transportation Needs: Not on file  Physical Activity: Not on file  Stress: Not on file  Social Connections: Not on file     Family History: The patient's Family history is unknown by patient.  ROS:   Please see the history of present illness.    All other systems reviewed and are negative.  EKGs/Labs/Other Studies Reviewed:    The following studies were reviewed today: Cardiac Studies & Procedures   CARDIAC CATHETERIZATION  CARDIAC CATHETERIZATION 03/06/2019  Narrative  Dist RCA lesion is 10% stenosed.  The left ventricular systolic function is normal.  LV end diastolic pressure is low.  The left ventricular ejection fraction is 55-65% by visual estimate.  No significant coronary obstructive disease with only minimal 10% plaque in the RCA in the region of the acute margin; normal-appearing LAD, ramus intermediate vessel, and left circumflex vessel.  Normal LV function without focal segmental wall motion abnormalities.  EF estimate is 60 to 65%.  LVEDP is 6 mmHg.  RECOMMENDATION: The patient is currently under evaluation with Dr. Graciela Husbands is wearing a Zio patch monitor to document PAF or recurrent atrial arrhythmias.  A 2D echo Doppler study had  been ordered but not yet done.  LVEDP is low.  Will hydrate post procedure.  Plan discharge later today with follow-up with Dr. Thomasene Ripple.  Findings Coronary Findings Diagnostic  Dominance: Right  Right Coronary Artery Dist RCA lesion is 10% stenosed.  Intervention  No interventions have been documented.   STRESS TESTS  EXERCISE TOLERANCE TEST (ETT) 08/28/2020  Narrative  Blood pressure demonstrated a hypertensive response to exercise.  Downsloping ST segment depression ST segment depression of 2 mm was noted during stress in the II, III, V4, V5, V6 and aVF leads, and returning to baseline after 1-5 minutes of recovery.   ECHOCARDIOGRAM  ECHOCARDIOGRAM COMPLETE 09/09/2021  Narrative ECHOCARDIOGRAM REPORT    Patient Name:   Wanda Freeman Date of Exam: 09/09/2021 Medical Rec #:  [id number]      Height:  67.0 in Accession #:    [id number]     Weight:       184.8 lb Date of Birth:  12/23/1950       BSA:          1.955 m Patient Age:    70 years       BP:           130/72 mmHg Patient Gender: F              HR:           62 bpm. Exam Location:  [address]  Procedure: 2D Echo, 3D Echo, Cardiac Doppler and Color Doppler  Indications:    R06.09 Dyspnea  History:        Patient has prior history of Echocardiogram examinations, most recent 03/15/2019. Arrythmias:PVC, PAC and Atrial Fibrillation, Signs/Symptoms:Chest Pain; Risk Factors:Dyslipidemia and Hypertension. Palpitations.  Sonographer:    Farrel ConnersBethany Mcmahill RDCS Referring Phys: Barry DienesHENRY W Sent[facility] R[facility] Medical CenterMITH  IMPRESSIONS   1. Left ventricular ejection fraction, by estimation, is 60 to 65%. Left ventricular ejection fraction by 3D volume is 65 %. The left ventricle has normal function. The left ventricle has no regional wall motion abnormalities. Left ventricular diastolic parameters are consistent with Grade I diastolic dysfunction (impaired relaxation). 2. Right ventricular systolic function is normal. The right ventricular  size is normal. There is normal pulmonary artery systolic pressure. 3. The mitral valve is abnormal billowing of the anterior mitral valve leaflet without true prolapse. Trivial mitral valve regurgitation. No evidence of mitral stenosis. 4. The aortic valve is tricuspid. Aortic valve regurgitation is mild. No aortic stenosis is present. Aortic regurgitation PHT measures 542 msec. 2D Vena Contract 1.8 mm  Comparison(s): Prior images reviewed side by side. Aortic regurgitation new from prior.  FINDINGS Left Ventricle: Left ventricular ejection fraction, by estimation, is 60 to 65%. Left ventricular ejection fraction by 3D volume is 65 %. The left ventricle has normal function. The left ventricle has no regional wall motion abnormalities. The left ventricular internal cavity size was normal in size. There is no left ventricular hypertrophy. Left ventricular diastolic parameters are consistent with Grade I diastolic dysfunction (impaired relaxation).  Right Ventricle: The right ventricular size is normal. No increase in right ventricular wall thickness. Right ventricular systolic function is normal. There is normal pulmonary artery systolic pressure. The tricuspid regurgitant velocity is 2.39 m/s, and with an assumed right atrial pressure of 3 mmHg, the estimated right ventricular systolic pressure is 25.8 mmHg.  Left Atrium: Left atrial size was normal in size.  Right Atrium: Right atrial size was normal in size.  Pericardium: There is no evidence of pericardial effusion.  Mitral Valve: The mitral valve is abnormal. Trivial mitral valve regurgitation. No evidence of mitral valve stenosis.  Tricuspid Valve: The tricuspid valve is normal in structure. Tricuspid valve regurgitation is trivial. No evidence of tricuspid stenosis.  Aortic Valve: 1.8 mm. The aortic valve is tricuspid. Aortic valve regurgitation is mild. Aortic regurgitation PHT measures 542 msec. No aortic stenosis is  present.  Pulmonic Valve: The pulmonic valve was normal in structure. Pulmonic valve regurgitation is mild to moderate. No evidence of pulmonic stenosis.  Aorta: The aortic root and ascending aorta are structurally normal, with no evidence of dilitation.  Pulmonary Artery: Two Jets.  IAS/Shunts: No atrial level shunt detected by color flow Doppler.   LEFT VENTRICLE PLAX 2D LVIDd:         4.80 cm  Diastology LVIDs:         2.70 cm         LV e' medial:    6.96 cm/s LV PW:         0.80 cm         LV E/e' medial:  10.6 LV IVS:        0.80 cm         LV e' lateral:   13.70 cm/s LVOT diam:     2.40 cm         LV E/e' lateral: 5.4 LV SV:         94 LV SV Index:   48 LVOT Area:     4.52 cm        3D Volume EF LV 3D EF:    Left ventricul ar ejection fraction by 3D volume is 65 %.  3D Volume EF: 3D EF:        65 % LV EDV:       122 ml LV ESV:       43 ml LV SV:        79 ml  RIGHT VENTRICLE RV Basal diam:  3.90 cm RV S prime:     23.10 cm/s TAPSE (M-mode): 2.6 cm  LEFT ATRIUM             Index        RIGHT ATRIUM           Index LA diam:        4.20 cm 2.15 cm/m   RA Area:     21.10 cm LA Vol (A2C):   79.2 ml 40.50 ml/m  RA Volume:   62.90 ml  32.17 ml/m LA Vol (A4C):   47.4 ml 24.24 ml/m LA Biplane Vol: 62.2 ml 31.81 ml/m AORTIC VALVE LVOT Vmax:   87.50 cm/s LVOT Vmean:  56.850 cm/s LVOT VTI:    0.209 m AI PHT:      542 msec  AORTA Ao Root diam: 3.20 cm Ao Asc diam:  3.20 cm  MITRAL VALVE               TRICUSPID VALVE MV Area (PHT)  cm         TR Peak grad:   22.8 mmHg MV Decel Time: 236 msec    TR Vmax:        239.00 cm/s MV E velocity: 74.10 cm/s MV A velocity: 62.25 cm/s  SHUNTS MV E/A ratio:  1.19        Systemic VTI:  0.21 m Systemic Diam: 2.40 cm  Riley Lam MD Electronically signed by Riley Lam MD Signature Date/Time: 09/09/2021/6:32:37 PM    Final    MONITORS  LONG TERM MONITOR (3-14 DAYS)  05/06/2019  Narrative Indication: atrial fib  Duration: 14d  Findings Triggered event  Sinus with PAC PVCs rare PACs frequent 3.6% SVT nonsustained common but the longest run was 6 beats   CT SCANS  CT CORONARY MORPH W/CTA COR W/SCORE 11/21/2020  Addendum 11/21/2020  8:30 AM ADDENDUM REPORT: 11/21/2020 08:28  EXAM: OVER-READ INTERPRETATION  CT CHEST  The following report is an over-read performed by radiologist Dr. Jeronimo Greaves of [facility] on 11/21/2020. This over-read does not include interpretation of cardiac or coronary anatomy or pathology. The coronary CTA interpretation by the cardiologist is attached.  COMPARISON:  03/05/2019 chest radiograph from [facility]  FINDINGS: Vascular: Aortic atherosclerosis. No central pulmonary embolism, on this non-dedicated study.  Mediastinum/Nodes: No imaged thoracic adenopathy.  Lungs/Pleura: No pleural fluid.  Mild bibasilar scarring.  Upper Abdomen: Normal imaged portions of the liver, spleen, stomach.  Musculoskeletal: Mild osteopenia.  Lower thoracic spondylosis.  IMPRESSION: No acute findings in the imaged extracardiac chest.  Aortic Atherosclerosis (ICD10-I70.0).   Electronically Signed By: Jeronimo Greaves M.D. On: 11/21/2020 08:28  Narrative CLINICAL DATA:  Chest pain  EXAM: Cardiac CTA  MEDICATIONS: Sub lingual nitro. 4mg  and coreg 12.5 mg  TECHNIQUE: The patient was scanned on a Siemens Force 192 slice scanner. Gantry rotation speed was 250 msecs. Collimation was .6 mm. A 100 kV prospective scan was triggered in the ascending thoracic aorta at 140 HU's Full mA was used between 35% and 75% of the R-R interval. Average HR during the scan was 58 bpm. The 3D data set was interpreted on a dedicated work station using MPR, MIP and VRT modes. A total of 80cc of contrast was used.  FINDINGS: Non-cardiac: See separate report from [facility]. No significant findings on limited lung  and soft tissue windows.  Calcium Score: Minimal calcium noted in proximal LAD/Circumflex and distal RCA  Coronary Arteries: Right dominant with no anomalies  LM: Normal  LAD: 1-24% calcified plaque proximally  IM: Large vessel normal  D1: Normal small  D2: Normal small  Circumflex: 1-24% calcified plaque proximally  OM1: Normal  OM2: Normal  RCA: 1-24% calcified plaque distally  PDA: Normal  PLA: Normal  IMPRESSION: 1. Calcium score 2.9 which is only 36 th percentile for age/sex  2.  CAD RADS 1 non obstructive coronary artery disease  3.  Normal aortic root 3.1 cm  4.  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 On: 11/20/2020 15:19           EKG:  EKG is not ordered today.    Recent Labs: 01/17/2022: ALT 18; BUN 13; Creatinine, Ser 0.76; Hemoglobin 12.9; Platelets 203; Potassium 3.9; Sodium 140  Recent Lipid Panel    Component Value Date/Time   CHOL 128 08/20/2021 1443   TRIG 74 08/20/2021 1443   HDL 57 08/20/2021 1443   CHOLHDL 2.2 08/20/2021 1443   LDLCALC 56 08/20/2021 1443     Risk Assessment/Calculations:                Physical Exam:    VS:  BP 104/62   Pulse 68   Ht 5\' 7"  (1.702 m)   Wt 168 lb 3.2 oz (76.3 kg)   SpO2 97%   BMI 26.34 kg/m     Wt Readings from Last 3 Encounters:  07/13/22 168 lb 3.2 oz (76.3 kg)  08/20/21 184 lb 12.8 oz (83.8 kg)  07/15/21 180 lb 3.2 oz (81.7 kg)     GEN:  Well nourished, well developed in no acute distress HEENT: Normal NECK: No JVD; No carotid bruits LYMPHATICS: No lymphadenopathy CARDIAC: RRR, no murmurs, rubs, gallops RESPIRATORY:  Clear to auscultation without rales, wheezing or rhonchi  ABDOMEN: Soft, non-tender, non-distended MUSCULOSKELETAL:  No edema; No deformity  SKIN: Warm and dry NEUROLOGIC:  Alert and oriented x 3 PSYCHIATRIC:  Normal affect    ASSESSMENT:    1. Microvascular angina     PLAN:    In order of problems listed above:  Reviewed treatment options I reviewed all of her past records.  I did some investigation on medications like  Jardiance but there is no data currently available for this and treatment of coronary microvascular dysfunction that I can find.  She runs a low blood pressure.  Her LVEDP at the time of cardiac catheterization was only 6 mmHg.  I think she is going to have a hard time tolerating much antianginal therapy.  I recommended that she reduce her carvedilol dose to 6.25 mg twice daily and stop taking losartan.  Her home blood pressure often runs in the 90s.  I think this will be a good starting point and if we need to, we can slowly reintroduce antianginal therapy for vascular dysfunction in the future.  I will see her back in 6 months.  Will try to do some reading about whether there are any other alternative therapies available.  We will also think about referring her to a tertiary center if we can find somebody with expertise in this area.           Medication Adjustments/Labs and Tests Ordered: Current medicines are reviewed at length with the patient today.  Concerns regarding medicines are outlined above.  No orders of the defined types were placed in this encounter.  Meds ordered this encounter  Medications   carvedilol (COREG) 6.25 MG tablet    Sig: Take 1 tablet (6.25 mg total) by mouth 2 (two) times daily.    Dispense:  180 tablet    Refill:  3    Dose DECREASE    Patient Instructions  Medication Instructions:  DECREASE Carvedilol to 6.25mg  twice daily STOP Losartan *If you need a refill on your cardiac medications before your next appointment, please call your pharmacy*   Lab Work: NONE If you have labs (blood work) drawn today and your tests are completely normal, you will receive your results only by: MyChart Message (if you have MyChart) OR A paper copy in the mail If you  have any lab test that is abnormal or we need to change your treatment, we will call you to review the results.   Testing/Procedures: NONE   Follow-Up: At [facility], you and your health needs are our priority.  As part of our continuing mission to provide you with exceptional heart care, we have created designated Provider Care Teams.  These Care Teams include your primary Cardiologist (physician) and Advanced Practice Providers (APPs -  Physician Assistants and Nurse Practitioners) who all work together to provide you with the care you need, when you need it.  We recommend signing up for the patient portal called "MyChart".  Sign up information is provided on this After Visit Summary.  MyChart is used to connect with patients for Virtual Visits (Telemedicine).  Patients are able to view lab/test results, encounter notes, upcoming appointments, etc.  Non-urgent messages can be sent to your provider as well.   To learn more about what you can do with MyChart, go to [email or web address].    Your next appointment:   6 month(s)  Provider:   Tonny Bollman, MD     Signed, Tonny Bollman, MD  07/13/2022 5:46 PM    [facility]

## 2022-07-13 NOTE — Patient Instructions (Signed)
Medication Instructions:  DECREASE Carvedilol to 6.25mg  twice daily STOP Losartan *If you need a refill on your cardiac medications before your next appointment, please call your pharmacy*   Lab Work: NONE If you have labs (blood work) drawn today and your tests are completely normal, you will receive your results only by: MyChart Message (if you have MyChart) OR A paper copy in the mail If you have any lab test that is abnormal or we need to change your treatment, we will call you to review the results.   Testing/Procedures: NONE   Follow-Up: At [facility], you and your health needs are our priority.  As part of our continuing mission to provide you with exceptional heart care, we have created designated Provider Care Teams.  These Care Teams include your primary Cardiologist (physician) and Advanced Practice Providers (APPs -  Physician Assistants and Nurse Practitioners) who all work together to provide you with the care you need, when you need it.  We recommend signing up for the patient portal called "MyChart".  Sign up information is provided on this After Visit Summary.  MyChart is used to connect with patients for Virtual Visits (Telemedicine).  Patients are able to view lab/test results, encounter notes, upcoming appointments, etc.  Non-urgent messages can be sent to your provider as well.   To learn more about what you can do with MyChart, go to [email or web address].    Your next appointment:   6 month(s)  Provider:   Tonny Bollman, MD

## 2022-07-14 NOTE — Progress Notes (Signed)
Carelink Summary Report / Loop Recorder 

## 2022-07-15 DIAGNOSIS — E039 Hypothyroidism, unspecified: Secondary | ICD-10-CM | POA: Diagnosis not present

## 2022-07-15 DIAGNOSIS — R5383 Other fatigue: Secondary | ICD-10-CM | POA: Diagnosis not present

## 2022-07-15 DIAGNOSIS — R7309 Other abnormal glucose: Secondary | ICD-10-CM | POA: Diagnosis not present

## 2022-07-15 DIAGNOSIS — E785 Hyperlipidemia, unspecified: Secondary | ICD-10-CM | POA: Diagnosis not present

## 2022-07-15 DIAGNOSIS — I700 Atherosclerosis of aorta: Secondary | ICD-10-CM | POA: Diagnosis not present

## 2022-07-15 DIAGNOSIS — Z6826 Body mass index (BMI) 26.0-26.9, adult: Secondary | ICD-10-CM | POA: Diagnosis not present

## 2022-07-15 DIAGNOSIS — I25119 Atherosclerotic heart disease of native coronary artery with unspecified angina pectoris: Secondary | ICD-10-CM | POA: Diagnosis not present

## 2022-07-15 DIAGNOSIS — R6889 Other general symptoms and signs: Secondary | ICD-10-CM | POA: Diagnosis not present

## 2022-07-15 DIAGNOSIS — Z79899 Other long term (current) drug therapy: Secondary | ICD-10-CM | POA: Diagnosis not present

## 2022-07-15 DIAGNOSIS — Z832 Family history of diseases of the blood and blood-forming organs and certain disorders involving the immune mechanism: Secondary | ICD-10-CM | POA: Diagnosis not present

## 2022-07-15 DIAGNOSIS — E559 Vitamin D deficiency, unspecified: Secondary | ICD-10-CM | POA: Diagnosis not present

## 2022-07-20 ENCOUNTER — Other Ambulatory Visit: Payer: Self-pay | Admitting: Internal Medicine

## 2022-07-20 ENCOUNTER — Other Ambulatory Visit: Payer: Self-pay

## 2022-07-20 MED ORDER — FLECAINIDE ACETATE 50 MG PO TABS: 50.0000 mg | ORAL_TABLET | Freq: Two times a day (BID) | ORAL | 3 refills | Status: DC

## 2022-08-12 ENCOUNTER — Ambulatory Visit (INDEPENDENT_AMBULATORY_CARE_PROVIDER_SITE_OTHER): Payer: Medicare Other

## 2022-08-12 DIAGNOSIS — Z9889 Other specified postprocedural states: Secondary | ICD-10-CM

## 2022-08-13 LAB — CUP PACEART REMOTE DEVICE CHECK
Date Time Interrogation Session: 20240508231015
Implantable Pulse Generator Implant Date: 20210315

## 2022-08-18 NOTE — Progress Notes (Signed)
Carelink Summary Report / Loop Recorder 

## 2022-08-27 DIAGNOSIS — E673 Hypervitaminosis D: Secondary | ICD-10-CM | POA: Diagnosis not present

## 2022-08-27 DIAGNOSIS — E538 Deficiency of other specified B group vitamins: Secondary | ICD-10-CM | POA: Diagnosis not present

## 2022-09-02 NOTE — Progress Notes (Signed)
Carelink Summary Report / Loop Recorder 

## 2022-09-14 ENCOUNTER — Other Ambulatory Visit: Payer: Self-pay | Admitting: Internal Medicine

## 2022-09-14 ENCOUNTER — Ambulatory Visit (INDEPENDENT_AMBULATORY_CARE_PROVIDER_SITE_OTHER): Payer: Medicare Other

## 2022-09-14 DIAGNOSIS — R002 Palpitations: Secondary | ICD-10-CM | POA: Diagnosis not present

## 2022-09-14 LAB — CUP PACEART REMOTE DEVICE CHECK
Date Time Interrogation Session: 20240609231233
Implantable Pulse Generator Implant Date: 20210315

## 2022-09-29 ENCOUNTER — Ambulatory Visit: Payer: Medicare Other | Attending: Internal Medicine | Admitting: Internal Medicine

## 2022-09-29 ENCOUNTER — Encounter: Payer: Self-pay | Admitting: Internal Medicine

## 2022-09-29 VITALS — BP 108/66 | HR 64 | Ht 67.0 in | Wt 167.8 lb

## 2022-09-29 DIAGNOSIS — I480 Paroxysmal atrial fibrillation: Secondary | ICD-10-CM | POA: Insufficient documentation

## 2022-09-29 DIAGNOSIS — R002 Palpitations: Secondary | ICD-10-CM | POA: Diagnosis not present

## 2022-09-29 DIAGNOSIS — Z9889 Other specified postprocedural states: Secondary | ICD-10-CM | POA: Insufficient documentation

## 2022-09-29 MED ORDER — NEBIVOLOL HCL 5 MG PO TABS: 5.0000 mg | ORAL_TABLET | Freq: Every day | ORAL | 1 refills | Status: DC

## 2022-09-29 MED ORDER — METFORMIN HCL ER 500 MG PO TB24: ORAL_TABLET | ORAL | 1 refills | Status: DC

## 2022-09-29 NOTE — Patient Instructions (Signed)
Medication Instructions:  - Your physician has recommended you make the following change in your medication:   1) Metformin is being refilled at 500 mg: - Take 1 tablet by mouth three times a day  2) Stop Coreg (Carvedilol)  3) Start Nebivolol 5 mg: - Take 1 tablet by mouth once daily   *If you need a refill on your cardiac medications before your next appointment, please call your pharmacy*   Lab Work: - none ordered  If you have labs (blood work) drawn today and your tests are completely normal, you will receive your results only by: MyChart Message (if you have MyChart) OR A paper copy in the mail If you have any lab test that is abnormal or we need to change your treatment, we will call you to review the results.   Testing/Procedures: - none ordered   Follow-Up: At [facility], you and your health needs are our priority.  As part of our continuing mission to provide you with exceptional heart care, we have created designated Provider Care Teams.  These Care Teams include your primary Cardiologist (physician) and Advanced Practice Providers (APPs -  Physician Assistants and Nurse Practitioners) who all work together to provide you with the care you need, when you need it.  We recommend signing up for the patient portal called "MyChart".  Sign up information is provided on this After Visit Summary.  MyChart is used to connect with patients for Virtual Visits (Telemedicine).  Patients are able to view lab/test results, encounter notes, upcoming appointments, etc.  Non-urgent messages can be sent to your provider as well.   To learn more about what you can do with MyChart, go to [email or web address].    Your next appointment:   4-5 week(s)- Telehealth Visit  Provider:   Sherryl Manges, MD    Other Instructions N/a

## 2022-09-29 NOTE — Progress Notes (Signed)
Patient Care Team: Philemon Kingdom, MD as PCP - General (Internal Medicine) Lyn Records, MD (Inactive) as PCP - Cardiology (Cardiology)   HPI  Wanda Freeman is a 72 y.o. female Seen in followup for symptomatic PACs for which we undertook beta blocker trials 7/17 also symptomatic PVCs; subsequently found to have atrial fibrillation-brief (on AliveCor monitor)  and has not yet anticoagulated Because of ongoing issues with presyncope and lightheadedness, with her apple watch showing abrupt changes in heart rate, we undertook a implantation of a Linq recorder (3/21) demonstrating nonsustained atrial tachycardia with frequent atrial and ventricular ectopy.  Treated with a combination of bisoprolol and flecainide; former stopped 2/2 hypotension.  Palpitations have been quiescient  Recurrent chest pain, subsequently appreciated to be associate with ventricular ectopy, prompted>> catheterization.  Chest pain has been appreciated to be associated with ventricular ectopy; subsequent cath and Dx of INOCA/microvascular angina.  At last visit losartan stopped and carvedilol  increased   ; remains on statin and ASA   Metformin has been the most effective medication for her chest pain symptoms.  She and her husband are looking into alternatives.  They found expertise at [facility] in [place] as well as potentially [facility].  As strongly encouraged them to avail themselves of that expertise.  She remains reluctant to go out and do things friends or travel because of fear of pain       DATE PR interval QRSduration PQRS Dose  4/21  124 70 194 0  7/21 152 84  50  3/22 144 84  50  4/23 140 82  50  6/24 156 86 242 50       . DATE TEST EF    7/17 Echo  55-65% %    11/20 LHC 55-65% Cors w/o obstruction   12/20 Echo  60-65%    8/22 CTA  CaScore 2.9  6/23 Echo  60-65%        Date Cr K TSH Hgb  1/21    0.523<<0.011    5/21 0.77 4.7  14.4 (8/21)  8/22 0.72 4.5     10/23 0.76  3.9  12.9    Thromboembolic risk factors ( age -34, Gender-1) for a CHADSVASc Score of 2 Past Medical History:  Diagnosis Date   PSVT (paroxysmal supraventricular tachycardia)    Thyroid disease    Vitamin D deficiency     Past Surgical History:  Procedure Laterality Date   LEFT HEART CATH AND CORONARY ANGIOGRAPHY N/A 03/06/2019   Procedure: LEFT HEART CATH AND CORONARY ANGIOGRAPHY;  Surgeon: Lennette Bihari, MD;  Location: [facility];  Service: Cardiovascular;  Laterality: N/A;   stab phlebectomy Left 05/25/2016   > 20 incisions by Josephina Gip MD   stab phlebectomy Right 06/08/2016   stab phlebectomy > 20 incisions right leg by Josephina Gip MD    TOTAL ABDOMINAL HYSTERECTOMY  2019    Current Outpatient Medications  Medication Sig Dispense Refill   ALPRAZolam (XANAX) 0.25 MG tablet Take 1 tablet (0.25 mg total) by mouth at bedtime as needed for anxiety. 30 tablet 0   aspirin EC 81 MG tablet Take 81 mg by mouth daily. Swallow whole.     atorvastatin (LIPITOR) 80 MG tablet TAKE 1 TABLET BY MOUTH EVERY DAY 90 tablet 3   estradiol (ESTRACE) 0.1 MG/GM vaginal cream Place 1 Applicatorful vaginally at bedtime.     flecainide (TAMBOCOR) 50 MG tablet Take 1 tablet (50 mg total)  by mouth 2 (two) times daily. 180 tablet 3   ketorolac (TORADOL) 10 MG tablet Take 1 tablet (10 mg total) by mouth every 6 (six) hours as needed. 30 tablet 0   nebivolol (BYSTOLIC) 5 MG tablet Take 1 tablet (5 mg total) by mouth daily. 90 tablet 1   nitroGLYCERIN (NITROSTAT) 0.4 MG SL tablet PLACE 1 TABLET UNDER THE TONGUE EVERY 5 MINUTES AS NEEDED FOR CHEST PAIN. 25 tablet 2   SYNTHROID 100 MCG tablet Take 100 mcg by mouth daily.     Vitamin D, Ergocalciferol, (DRISDOL) 50000 units CAPS capsule Take 50,000 Units by mouth every 7 (seven) days. Sunday     metFORMIN (GLUCOPHAGE-XR) 500 MG 24 hr tablet Take 1 tablet (500 mg) by mouth three times a day 270 tablet 1   No current facility-administered medications  for this visit.    Allergies  Allergen Reactions   Tizanidine Swelling and Other (See Comments)    Dizziness, tongue swelling      Review of Systems negative except from HPI and PMH  Physical Exam BP 108/66   Pulse 64   Ht 5\' 7"  (1.702 m)   Wt 167 lb 12.8 oz (76.1 kg)   SpO2 98%   BMI 26.28 kg/m  Well developed and well nourished in no acute distress HENT normal Neck supple with JVP-flat Clear Device pocket well healed; without hematoma or erythema.  There is no tethering  Regular rate and rhythm, no  gallop No  murmur Abd-soft with active BS No Clubbing cyanosis  edema Skin-warm and dry A & Oriented  Grossly normal sensory and motor function  ECG sinus   Device function is Normal.   See Paceart for details      Plan PAC/PVCs  Atrial fibrillation paroxysmal  Exertional chest discomfort/microvascular angina  Lightheadedness-presyncope  Implantable loop recorder (DOI 3/21)  P-QRS lengthening    Patient without interval atrial fibrillation.  Her PQRS has lengthened more than about 25% and I would recommend that we decrease her flecainide from 50--725 twice daily and see if we can keep her arrhythmia at bay on the lower dose of the flecainide.  She remains on aspirin.  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  She has found the most relief from the metformin, we reviewed the data which is actually from almost 20 years ago from the study at [facility] which was using 500 twice daily however, she is currently on at 500 3 times daily and we will renew it.  We also looked at work from [place] where they have recommended nebivolol with his vasodilatory properties; we will discontinue her carvedilol and start her on nebivolol 2.5 daily with possibility of uptitrating to 5.  She has not had benefit from losartan, and interestingly there was a study identifying that losartan added no benefit to ACE inhibitors, so we will consider using an ACE inhibitor in  addition to her nebivolol as a second strategy.  In the interim, have recommended that they pursue expertise in [place] [facility] or for care as well as to answer the questions she has regarding life limitations.   Sherryl Manges, MD. 09/29/2022 1:31 PM

## 2022-09-30 ENCOUNTER — Encounter: Payer: Self-pay | Admitting: Internal Medicine

## 2022-09-30 ENCOUNTER — Other Ambulatory Visit: Payer: Self-pay | Admitting: *Deleted

## 2022-09-30 MED ORDER — METFORMIN HCL ER 750 MG PO TB24: 750.0000 mg | ORAL_TABLET | Freq: Two times a day (BID) | ORAL | 1 refills | Status: DC

## 2022-10-05 NOTE — Progress Notes (Signed)
Carelink Summary Report / Loop Recorder 

## 2022-10-07 NOTE — Addendum Note (Signed)
Addended bySherri Rad C on: 10/07/2022 10:16 AM   Modules accepted: Orders

## 2022-10-19 ENCOUNTER — Ambulatory Visit: Payer: Medicare Other

## 2022-10-19 DIAGNOSIS — I480 Paroxysmal atrial fibrillation: Secondary | ICD-10-CM

## 2022-10-19 LAB — CUP PACEART REMOTE DEVICE CHECK
Date Time Interrogation Session: 20240712230834
Implantable Pulse Generator Implant Date: 20210315

## 2022-10-20 DIAGNOSIS — M4003 Postural kyphosis, cervicothoracic region: Secondary | ICD-10-CM | POA: Diagnosis not present

## 2022-10-20 DIAGNOSIS — E538 Deficiency of other specified B group vitamins: Secondary | ICD-10-CM | POA: Diagnosis not present

## 2022-10-20 DIAGNOSIS — E039 Hypothyroidism, unspecified: Secondary | ICD-10-CM | POA: Diagnosis not present

## 2022-10-20 DIAGNOSIS — Z6826 Body mass index (BMI) 26.0-26.9, adult: Secondary | ICD-10-CM | POA: Diagnosis not present

## 2022-10-20 DIAGNOSIS — Z1331 Encounter for screening for depression: Secondary | ICD-10-CM | POA: Diagnosis not present

## 2022-10-20 DIAGNOSIS — E559 Vitamin D deficiency, unspecified: Secondary | ICD-10-CM | POA: Diagnosis not present

## 2022-10-20 DIAGNOSIS — E785 Hyperlipidemia, unspecified: Secondary | ICD-10-CM | POA: Diagnosis not present

## 2022-10-20 DIAGNOSIS — M542 Cervicalgia: Secondary | ICD-10-CM | POA: Diagnosis not present

## 2022-10-20 DIAGNOSIS — I700 Atherosclerosis of aorta: Secondary | ICD-10-CM | POA: Diagnosis not present

## 2022-10-20 DIAGNOSIS — Z0000 Encounter for general adult medical examination without abnormal findings: Secondary | ICD-10-CM | POA: Diagnosis not present

## 2022-10-20 DIAGNOSIS — I25119 Atherosclerotic heart disease of native coronary artery with unspecified angina pectoris: Secondary | ICD-10-CM | POA: Diagnosis not present

## 2022-10-30 NOTE — Progress Notes (Signed)
Carelink Summary Report / Loop Recorder 

## 2022-11-17 ENCOUNTER — Ambulatory Visit: Payer: Medicare Other | Attending: Internal Medicine | Admitting: Internal Medicine

## 2022-11-17 ENCOUNTER — Encounter: Payer: Self-pay | Admitting: Internal Medicine

## 2022-11-17 VITALS — BP 116/65 | HR 83 | Ht 67.0 in | Wt 167.0 lb

## 2022-11-17 DIAGNOSIS — I493 Ventricular premature depolarization: Secondary | ICD-10-CM

## 2022-11-17 DIAGNOSIS — I2585 Chronic coronary microvascular dysfunction: Secondary | ICD-10-CM | POA: Diagnosis not present

## 2022-11-17 DIAGNOSIS — R55 Syncope and collapse: Secondary | ICD-10-CM | POA: Diagnosis not present

## 2022-11-17 DIAGNOSIS — R42 Dizziness and giddiness: Secondary | ICD-10-CM

## 2022-11-17 DIAGNOSIS — I491 Atrial premature depolarization: Secondary | ICD-10-CM

## 2022-11-17 DIAGNOSIS — I2089 Other forms of angina pectoris: Secondary | ICD-10-CM

## 2022-11-17 DIAGNOSIS — I480 Paroxysmal atrial fibrillation: Secondary | ICD-10-CM

## 2022-11-17 NOTE — Progress Notes (Signed)
Electrophysiology TeleHealth Note      Date:  11/17/2022   ID:  MARGUARITE HOTTEL, DOB 1950-07-22, MRN [id number]  Location: patient's home  Provider location: [address], [place]  Evaluation Performed: Follow-up visit  PCP:  Philemon Kingdom, MD  Cardiologist:     Electrophysiologist:  SK   Chief Complaint:      History of Present Illness:    Wanda Freeman is a 72 y.o. female who presents via video conferencing for a telehealth visit today.  Since last being seen in our clinic for palpitations with previously implanted loop recorder and exertional chest discomfort described as microvascular angina which for her has been quite life limiting and for which she was most recently taking metformin and for which we discontinued her carvedilol and started her on nebivolol the patient reports much improvement  and less BB fatigue.     The patient denies symptoms of fevers, chills, cough, or new SOB worrisome for COVID 19.    Past Medical History:  Diagnosis Date   PSVT (paroxysmal supraventricular tachycardia)    Thyroid disease    Vitamin D deficiency     Past Surgical History:  Procedure Laterality Date   LEFT HEART CATH AND CORONARY ANGIOGRAPHY N/A 03/06/2019   Procedure: LEFT HEART CATH AND CORONARY ANGIOGRAPHY;  Surgeon: Lennette Bihari, MD;  Location: [facility];  Service: Cardiovascular;  Laterality: N/A;   stab phlebectomy Left 05/25/2016   > 20 incisions by Josephina Gip MD   stab phlebectomy Right 06/08/2016   stab phlebectomy > 20 incisions right leg by Josephina Gip MD    TOTAL ABDOMINAL HYSTERECTOMY  2019    Current Outpatient Medications  Medication Sig Dispense Refill   ALPRAZolam (XANAX) 0.25 MG tablet Take 1 tablet (0.25 mg total) by mouth at bedtime as needed for anxiety. 30 tablet 0   aspirin EC 81 MG tablet Take 81 mg by mouth daily. Swallow whole.     atorvastatin (LIPITOR) 80 MG tablet TAKE 1 TABLET BY MOUTH EVERY DAY 90 tablet 3    cyanocobalamin (VITAMIN B12) 1000 MCG/ML injection every 14 (fourteen) days.     estradiol (ESTRACE) 0.1 MG/GM vaginal cream Place 1 Applicatorful vaginally as needed (dryness).     flecainide (TAMBOCOR) 50 MG tablet Take 1 tablet (50 mg total) by mouth 2 (two) times daily. 180 tablet 3   ketorolac (TORADOL) 10 MG tablet Take 1 tablet (10 mg total) by mouth every 6 (six) hours as needed. 30 tablet 0   metFORMIN (GLUCOPHAGE-XR) 750 MG 24 hr tablet Take 1 tablet (750 mg total) by mouth 2 (two) times daily. 180 tablet 1   nebivolol (BYSTOLIC) 5 MG tablet Take 1 tablet (5 mg total) by mouth daily. 90 tablet 1   nitroGLYCERIN (NITROSTAT) 0.4 MG SL tablet PLACE 1 TABLET UNDER THE TONGUE EVERY 5 MINUTES AS NEEDED FOR CHEST PAIN. 25 tablet 2   SYNTHROID 100 MCG tablet Take 100 mcg by mouth daily.     triamcinolone cream (KENALOG) 0.1 % Apply 1 Application topically 2 (two) times daily as needed.     No current facility-administered medications for this visit.    Allergies:   Tizanidine   ROS:  Please see the history of present illness.   All other systems are personally reviewed and negative.    Exam:    Vital Signs:  BP 116/65   Pulse 83   Ht 5\' 7"  (1.702 m)   Wt  167 lb (75.8 kg)   BMI 26.16 kg/m    Well appearing, alert and conversant, regular work of breathing,  good skin color Eyes- anicteric, neuro- grossly intact, skin- no apparent rash or lesions or cyanosis, mouth- oral mucosa is pink   Labs/Other Tests and Data Reviewed:    Recent Labs: 01/17/2022: ALT 18; BUN 13; Creatinine, Ser 0.76; Hemoglobin 12.9; Platelets 203; Potassium 3.9; Sodium 140   Wt Readings from Last 3 Encounters:  11/17/22 167 lb (75.8 kg)  09/29/22 167 lb 12.8 oz (76.1 kg)  07/13/22 168 lb 3.2 oz (76.3 kg)     Other studies personally reviewed:     ASSESSMENT & PLAN:    PAC/PVCs   Atrial fibrillation paroxysmal   Exertional chest discomfort/microvascular angina   Lightheadedness-presyncope    Implantable loop recorder (DOI 3/21)   P-QRS lengthening  Improved on nebivolol will continue and increase by 2.5 in the afternoon    [facility] ZADA  appt for microvascular  Follow-up:  4 month    Current medicines are reviewed at length with the patient today.   The patient  concerns regarding her medicines.  The following changes were made today:  wiill continue nebiviolol 5 q am and add 2.5 at 1400 hrs    Labs/ tests ordered today include:  No orders of the defined types were placed in this encounter.     Today, I have spent 21 minutes with the patient with telehealth technology discussing the above.  Signed, Sherryl Manges, MD  11/17/2022 10:54 AM     [facility] [address] [place] [address] [phone] (office) [phone] (fax)

## 2022-11-23 ENCOUNTER — Ambulatory Visit (INDEPENDENT_AMBULATORY_CARE_PROVIDER_SITE_OTHER): Payer: Medicare Other

## 2022-11-23 DIAGNOSIS — R002 Palpitations: Secondary | ICD-10-CM | POA: Diagnosis not present

## 2022-11-23 LAB — CUP PACEART REMOTE DEVICE CHECK
Date Time Interrogation Session: 20240818231205
Implantable Pulse Generator Implant Date: 20210315

## 2022-12-03 NOTE — Progress Notes (Signed)
Carelink Summary Report / Loop Recorder 

## 2022-12-09 DIAGNOSIS — R2689 Other abnormalities of gait and mobility: Secondary | ICD-10-CM | POA: Diagnosis not present

## 2022-12-09 DIAGNOSIS — M5489 Other dorsalgia: Secondary | ICD-10-CM | POA: Diagnosis not present

## 2022-12-09 DIAGNOSIS — M2560 Stiffness of unspecified joint, not elsewhere classified: Secondary | ICD-10-CM | POA: Diagnosis not present

## 2022-12-16 DIAGNOSIS — I999 Unspecified disorder of circulatory system: Secondary | ICD-10-CM | POA: Diagnosis not present

## 2022-12-16 DIAGNOSIS — I2089 Other forms of angina pectoris: Secondary | ICD-10-CM | POA: Diagnosis not present

## 2022-12-16 DIAGNOSIS — E785 Hyperlipidemia, unspecified: Secondary | ICD-10-CM | POA: Diagnosis not present

## 2022-12-16 DIAGNOSIS — I498 Other specified cardiac arrhythmias: Secondary | ICD-10-CM | POA: Diagnosis not present

## 2022-12-28 ENCOUNTER — Ambulatory Visit (INDEPENDENT_AMBULATORY_CARE_PROVIDER_SITE_OTHER): Payer: Medicare Other

## 2022-12-28 DIAGNOSIS — R002 Palpitations: Secondary | ICD-10-CM

## 2022-12-28 DIAGNOSIS — H2513 Age-related nuclear cataract, bilateral: Secondary | ICD-10-CM | POA: Diagnosis not present

## 2022-12-28 LAB — CUP PACEART REMOTE DEVICE CHECK
Date Time Interrogation Session: 20240920230239
Implantable Pulse Generator Implant Date: 20210315

## 2023-01-08 NOTE — Progress Notes (Signed)
Carelink Summary Report / Loop Recorder 

## 2023-01-26 DIAGNOSIS — Z23 Encounter for immunization: Secondary | ICD-10-CM | POA: Diagnosis not present

## 2023-01-26 DIAGNOSIS — E039 Hypothyroidism, unspecified: Secondary | ICD-10-CM | POA: Diagnosis not present

## 2023-01-26 DIAGNOSIS — E785 Hyperlipidemia, unspecified: Secondary | ICD-10-CM | POA: Diagnosis not present

## 2023-01-26 DIAGNOSIS — E538 Deficiency of other specified B group vitamins: Secondary | ICD-10-CM | POA: Diagnosis not present

## 2023-01-26 DIAGNOSIS — E673 Hypervitaminosis D: Secondary | ICD-10-CM | POA: Diagnosis not present

## 2023-01-26 DIAGNOSIS — Z79899 Other long term (current) drug therapy: Secondary | ICD-10-CM | POA: Diagnosis not present

## 2023-02-01 ENCOUNTER — Ambulatory Visit: Payer: Medicare Other

## 2023-02-01 DIAGNOSIS — R002 Palpitations: Secondary | ICD-10-CM | POA: Diagnosis not present

## 2023-02-02 LAB — CUP PACEART REMOTE DEVICE CHECK
Date Time Interrogation Session: 20241027230132
Implantable Pulse Generator Implant Date: 20210315

## 2023-02-18 NOTE — Progress Notes (Signed)
Carelink Summary Report / Loop Recorder 

## 2023-03-07 LAB — CUP PACEART REMOTE DEVICE CHECK
Date Time Interrogation Session: 20241129230028
Implantable Pulse Generator Implant Date: 20210315

## 2023-03-08 ENCOUNTER — Ambulatory Visit (INDEPENDENT_AMBULATORY_CARE_PROVIDER_SITE_OTHER): Payer: Medicare Other

## 2023-03-08 DIAGNOSIS — R002 Palpitations: Secondary | ICD-10-CM | POA: Diagnosis not present

## 2023-03-19 ENCOUNTER — Encounter: Payer: Self-pay | Admitting: Internal Medicine

## 2023-03-19 MED ORDER — NEBIVOLOL HCL 5 MG PO TABS: 5.0000 mg | ORAL_TABLET | Freq: Every day | ORAL | 1 refills | Status: DC

## 2023-03-19 MED ORDER — METFORMIN HCL ER 750 MG PO TB24: 750.0000 mg | ORAL_TABLET | Freq: Two times a day (BID) | ORAL | 1 refills | Status: DC

## 2023-03-22 ENCOUNTER — Other Ambulatory Visit: Payer: Self-pay | Admitting: Internal Medicine

## 2023-03-22 ENCOUNTER — Other Ambulatory Visit: Payer: Self-pay

## 2023-03-22 MED ORDER — NEBIVOLOL HCL 5 MG PO TABS: ORAL_TABLET | ORAL | 3 refills | Status: DC

## 2023-04-12 ENCOUNTER — Ambulatory Visit (INDEPENDENT_AMBULATORY_CARE_PROVIDER_SITE_OTHER): Payer: Medicare Other

## 2023-04-12 DIAGNOSIS — R002 Palpitations: Secondary | ICD-10-CM | POA: Diagnosis not present

## 2023-04-12 LAB — CUP PACEART REMOTE DEVICE CHECK
Date Time Interrogation Session: 20250105230119
Implantable Pulse Generator Implant Date: 20210315

## 2023-04-19 ENCOUNTER — Encounter: Payer: Self-pay | Admitting: Cardiovascular Disease

## 2023-04-19 ENCOUNTER — Ambulatory Visit: Payer: Medicare Other | Attending: Cardiovascular Disease | Admitting: Cardiovascular Disease

## 2023-04-19 VITALS — BP 130/90 | HR 62 | Ht 67.0 in | Wt 171.2 lb

## 2023-04-19 DIAGNOSIS — I2089 Other forms of angina pectoris: Secondary | ICD-10-CM | POA: Insufficient documentation

## 2023-04-19 DIAGNOSIS — R079 Chest pain, unspecified: Secondary | ICD-10-CM | POA: Diagnosis not present

## 2023-04-19 LAB — TROPONIN T: Troponin T (Highly Sensitive): 14 ng/L (ref 0–14)

## 2023-04-19 MED ORDER — LISINOPRIL 10 MG PO TABS: 10.0000 mg | ORAL_TABLET | Freq: Every day | ORAL | 3 refills | Status: DC

## 2023-04-19 NOTE — Patient Instructions (Signed)
 Medication Instructions:  START Lisinopril  10mg  daily *If you need a refill on your cardiac medications before your next appointment, please call your pharmacy*  Lab Work: Troponin T today If you have labs (blood work) drawn today and your tests are completely normal, you will receive your results only by: MyChart Message (if you have MyChart) OR A paper copy in the mail If you have any lab test that is abnormal or we need to change your treatment, we will call you to review the results.  Follow-Up: At [facility], you and your health needs are our priority.  As part of our continuing mission to provide you with exceptional heart care, we have created designated Provider Care Teams.  These Care Teams include your primary Cardiologist (physician) and Advanced Practice Providers (APPs -  Physician Assistants and Nurse Practitioners) who all work together to provide you with the care you need, when you need it.  Your next appointment:   6 month(s)  Provider:   Ozell Fell, MD

## 2023-04-19 NOTE — Progress Notes (Signed)
 Cardiology Office Note:    Date:  04/19/2023   ID:  Wanda Freeman, DOB 10/28/1950, MRN [id number]  PCP:  Wanda Fitch, MD   [facility] Providers Cardiologist:  Wanda LELON Claudene DOUGLAS, MD (Inactive)     Referring MD: Wanda Fitch, MD   Chief Complaint  Patient presents with   Chest Pain    History of Present Illness:    Wanda Freeman is a 73 y.o. female with a hx of microvascular angina, presenting for follow-up evaluation.  The patient was seen in April 2024 when I picked up her care from Dr. Claudene who had retired.  She initially presented in 2020 with severe chest pain and was found to have minimal epicardial CAD.  She has had chronic chest discomfort with abnormal stress ECG testing with significant 2 mm ST depression seen.  A gated coronary CT again showed no epicardial CAD and the patient was diagnosed with microvascular dysfunction based on her typical angina and abnormal exercise EKG findings.  She could not tolerate calcium  channel's or long-acting nitrates.  She did not benefit from Ranexa  and it was discontinued because of constipation.  The patient is here with her husband today.  She has been having a lot of chest pain over the last 48 hours.  She has seen Dr. Fernande since the time of my last appointment.  He started her on nebivolol  and stopped carvedilol .  This did improve her anginal symptoms to an extent.  Yesterday she reports severe chest discomfort that occurred much of the day.  It is described that it comes in waves, can start with a sharp left-sided chest pain, and then turned into an ache afterwards.  Today her symptoms are still present but they are improved.  She also has a component of exertional angina but this appears to be stable.  No shortness of breath, orthopnea, or PND.  There are occasional heart palpitations that do not appear to be related to her anginal chest pain.  Current Medications: Current Meds  Medication Sig   aspirin  EC 81 MG  tablet Take 81 mg by mouth daily. Swallow whole.   atorvastatin  (LIPITOR) 80 MG tablet TAKE 1 TABLET BY MOUTH EVERY DAY   cyanocobalamin  (VITAMIN B12) 1000 MCG/ML injection every 14 (fourteen) days.   estradiol (ESTRACE) 0.1 MG/GM vaginal cream Place 1 Applicatorful vaginally as needed (dryness).   flecainide  (TAMBOCOR ) 50 MG tablet Take 1 tablet (50 mg total) by mouth 2 (two) times daily. (Patient taking differently: Take 50 mg by mouth 2 (two) times daily. Per patient taking 1/2 tablet twice a day)   ketorolac  (TORADOL ) 10 MG tablet Take 1 tablet (10 mg total) by mouth every 6 (six) hours as needed.   lisinopril  (ZESTRIL ) 10 MG tablet Take 1 tablet (10 mg total) by mouth daily.   metFORMIN  (GLUCOPHAGE -XR) 750 MG 24 hr tablet Take 1 tablet (750 mg total) by mouth 2 (two) times daily.   nebivolol  (BYSTOLIC ) 5 MG tablet Take 1 tablet (5 mg) in the morning and 0.5 tablet (2.5 mg) in the afternoon.   nitroGLYCERIN  (NITROSTAT ) 0.4 MG SL tablet PLACE 1 TABLET UNDER THE TONGUE EVERY 5 MINUTES AS NEEDED FOR CHEST PAIN.   SYNTHROID 100 MCG tablet Take 100 mcg by mouth daily.   triamcinolone cream (KENALOG) 0.1 % Apply 1 Application topically 2 (two) times daily as needed.   [DISCONTINUED] ALPRAZolam  (XANAX ) 0.25 MG tablet Take 1 tablet (0.25 mg total) by mouth at bedtime as needed for  anxiety.     Allergies:   Tizanidine   ROS:   Please see the history of present illness.    All other systems reviewed and are negative.  EKGs/Labs/Other Studies Reviewed:    The following studies were reviewed today: Cardiac Studies & Procedures   CARDIAC CATHETERIZATION  CARDIAC CATHETERIZATION 03/06/2019  Narrative  Dist RCA lesion is 10% stenosed.  The left ventricular systolic function is normal.  LV end diastolic pressure is low.  The left ventricular ejection fraction is 55-65% by visual estimate.  No significant coronary obstructive disease with only minimal 10% plaque in the RCA in the region of  the acute margin; normal-appearing LAD, ramus intermediate vessel, and left circumflex vessel.  Normal LV function without focal segmental wall motion abnormalities.  EF estimate is 60 to 65%.  LVEDP is 6 mmHg.  RECOMMENDATION: The patient is currently under evaluation with Dr. Fernande is wearing a Zio patch monitor to document PAF or recurrent atrial arrhythmias.  A 2D echo Doppler study had been ordered but not yet done.  LVEDP is low.  Will hydrate post procedure.  Plan discharge later today with follow-up with Dr. Kardie Freeman.  Findings Coronary Findings Diagnostic  Dominance: Right  Right Coronary Artery Dist RCA lesion is 10% stenosed.  Intervention  No interventions have been documented.   STRESS TESTS  EXERCISE TOLERANCE TEST (ETT) 08/27/2020  Narrative  Blood pressure demonstrated a hypertensive response to exercise.  Downsloping ST segment depression ST segment depression of 2 mm was noted during stress in the II, III, V4, V5, V6 and aVF leads, and returning to baseline after 1-5 minutes of recovery.  ECHOCARDIOGRAM  ECHOCARDIOGRAM COMPLETE 09/09/2021  Narrative ECHOCARDIOGRAM REPORT    Patient Name:   Wanda Freeman Date of Exam: 09/09/2021 Medical Rec #:  [id number]      Height:       67.0 in Accession #:    [id number]     Weight:       184.8 lb Date of Birth:  16-Sep-1950       BSA:          1.955 m Patient Age:    73 years       BP:           130/72 mmHg Patient Gender: F              HR:           62 bpm. Exam Location:  [address]  Procedure: 2D Echo, 3D Echo, Cardiac Doppler and Color Doppler  Indications:    R06.09 Dyspnea  History:        Patient has prior history of Echocardiogram examinations, most recent 03/15/2019. Arrythmias:PVC, PAC and Atrial Fibrillation, Signs/Symptoms:Chest Pain; Risk Factors:Dyslipidemia and Hypertension. Palpitations.  Sonographer:    Heather Hawks RDCS Referring Phys: Wanda ORN [facility]  IMPRESSIONS   1. Left  ventricular ejection fraction, by estimation, is 60 to 65%. Left ventricular ejection fraction by 3D volume is 65 %. The left ventricle has normal function. The left ventricle has no regional wall motion abnormalities. Left ventricular diastolic parameters are consistent with Grade I diastolic dysfunction (impaired relaxation). 2. Right ventricular systolic function is normal. The right ventricular size is normal. There is normal pulmonary artery systolic pressure. 3. The mitral valve is abnormal billowing of the anterior mitral valve leaflet without true prolapse. Trivial mitral valve regurgitation. No evidence of mitral stenosis. 4. The aortic valve is tricuspid. Aortic valve regurgitation is mild. No  aortic stenosis is present. Aortic regurgitation PHT measures 542 msec. 2D Vena Contract 1.8 mm  Comparison(s): Prior images reviewed side by side. Aortic regurgitation new from prior.  FINDINGS Left Ventricle: Left ventricular ejection fraction, by estimation, is 60 to 65%. Left ventricular ejection fraction by 3D volume is 65 %. The left ventricle has normal function. The left ventricle has no regional wall motion abnormalities. The left ventricular internal cavity size was normal in size. There is no left ventricular hypertrophy. Left ventricular diastolic parameters are consistent with Grade I diastolic dysfunction (impaired relaxation).  Right Ventricle: The right ventricular size is normal. No increase in right ventricular wall thickness. Right ventricular systolic function is normal. There is normal pulmonary artery systolic pressure. The tricuspid regurgitant velocity is 2.39 m/s, and with an assumed right atrial pressure of 3 mmHg, the estimated right ventricular systolic pressure is 25.8 mmHg.  Left Atrium: Left atrial size was normal in size.  Right Atrium: Right atrial size was normal in size.  Pericardium: There is no evidence of pericardial effusion.  Mitral Valve: The mitral valve  is abnormal. Trivial mitral valve regurgitation. No evidence of mitral valve stenosis.  Tricuspid Valve: The tricuspid valve is normal in structure. Tricuspid valve regurgitation is trivial. No evidence of tricuspid stenosis.  Aortic Valve: 1.8 mm. The aortic valve is tricuspid. Aortic valve regurgitation is mild. Aortic regurgitation PHT measures 542 msec. No aortic stenosis is present.  Pulmonic Valve: The pulmonic valve was normal in structure. Pulmonic valve regurgitation is mild to moderate. No evidence of pulmonic stenosis.  Aorta: The aortic root and ascending aorta are structurally normal, with no evidence of dilitation.  Pulmonary Artery: Two Jets.  IAS/Shunts: No atrial level shunt detected by color flow Doppler.   LEFT VENTRICLE PLAX 2D LVIDd:         4.80 cm         Diastology LVIDs:         2.70 cm         LV e' medial:    6.96 cm/s LV PW:         0.80 cm         LV E/e' medial:  10.6 LV IVS:        0.80 cm         LV e' lateral:   13.70 cm/s LVOT diam:     2.40 cm         LV E/e' lateral: 5.4 LV SV:         94 LV SV Index:   48 LVOT Area:     4.52 cm        3D Volume EF LV 3D EF:    Left ventricul ar ejection fraction by 3D volume is 65 %.  3D Volume EF: 3D EF:        65 % LV EDV:       122 ml LV ESV:       43 ml LV SV:        79 ml  RIGHT VENTRICLE RV Basal diam:  3.90 cm RV S prime:     23.10 cm/s TAPSE (M-mode): 2.6 cm  LEFT ATRIUM             Index        RIGHT ATRIUM           Index LA diam:        4.20 cm 2.15 cm/m   RA Area:  21.10 cm LA Vol (A2C):   79.2 ml 40.50 ml/m  RA Volume:   62.90 ml  32.17 ml/m LA Vol (A4C):   47.4 ml 24.24 ml/m LA Biplane Vol: 62.2 ml 31.81 ml/m AORTIC VALVE LVOT Vmax:   87.50 cm/s LVOT Vmean:  56.850 cm/s LVOT VTI:    0.209 m AI PHT:      542 msec  AORTA Ao Root diam: 3.20 cm Ao Asc diam:  3.20 cm  MITRAL VALVE               TRICUSPID VALVE MV Area (PHT)  cm         TR Peak grad:   22.8 mmHg MV  Decel Time: 236 msec    TR Vmax:        239.00 cm/s MV E velocity: 74.10 cm/s MV A velocity: 62.25 cm/s  SHUNTS MV E/A ratio:  1.19        Systemic VTI:  0.21 m Systemic Diam: 2.40 cm  Stanly Leavens MD Electronically signed by Stanly Leavens MD Signature Date/Time: 09/09/2021/6:32:37 PM    Final   MONITORS  LONG TERM MONITOR (3-14 DAYS) 04/17/2019  Narrative Indication: atrial fib  Duration: 14d  Findings Triggered event  Sinus with PAC PVCs rare PACs frequent 3.6% SVT nonsustained common but the longest run was 6 beats  CT SCANS  CT CORONARY MORPH W/CTA COR W/SCORE 11/20/2020  Addendum 11/21/2020  8:30 AM ADDENDUM REPORT: 11/21/2020 08:28  EXAM: OVER-READ INTERPRETATION  CT CHEST  The following report is an over-read performed by radiologist Dr. Rockey Kilts of [facility] on 11/21/2020. This over-read does not include interpretation of cardiac or coronary anatomy or pathology. The coronary CTA interpretation by the cardiologist is attached.  COMPARISON:  03/05/2019 chest radiograph from [facility]  FINDINGS: Vascular: Aortic atherosclerosis. No central pulmonary embolism, on this non-dedicated study.  Mediastinum/Nodes: No imaged thoracic adenopathy.  Lungs/Pleura: No pleural fluid.  Mild bibasilar scarring.  Upper Abdomen: Normal imaged portions of the liver, spleen, stomach.  Musculoskeletal: Mild osteopenia.  Lower thoracic spondylosis.  IMPRESSION: No acute findings in the imaged extracardiac chest.  Aortic Atherosclerosis (ICD10-I70.0).   Electronically Signed By: Rockey Kilts M.D. On: 11/21/2020 08:28  Narrative CLINICAL DATA:  Chest pain  EXAM: Cardiac CTA  MEDICATIONS: Sub lingual nitro. 4mg  and coreg  12.5 mg  TECHNIQUE: The patient was scanned on a Siemens Force 192 slice scanner. Gantry rotation speed was 250 msecs. Collimation was .6 mm. A 100 kV prospective scan was triggered in the ascending thoracic  aorta at 140 HU's Full mA was used between 35% and 75% of the R-R interval. Average HR during the scan was 58 bpm. The 3D data set was interpreted on a dedicated work station using MPR, MIP and VRT modes. A total of 80cc of contrast was used.  FINDINGS: Non-cardiac: See separate report from [facility]. No significant findings on limited lung and soft tissue windows.  Calcium  Score: Minimal calcium  noted in proximal LAD/Circumflex and distal RCA  Coronary Arteries: Right dominant with no anomalies  LM: Normal  LAD: 1-24% calcified plaque proximally  IM: Large vessel normal  D1: Normal small  D2: Normal small  Circumflex: 1-24% calcified plaque proximally  OM1: Normal  OM2: Normal  RCA: 1-24% calcified plaque distally  PDA: Normal  PLA: Normal  IMPRESSION: 1. Calcium  score 2.9 which is only 36 th percentile for age/sex  2.  CAD RADS 1 non obstructive coronary artery disease  3.  Normal  aortic root 3.1 cm  4.  Possible small PFO  Patient will f/u with Dr Claudene for further w/u and Rx of presumed microvascular angina  Maude Emmer  Electronically Signed: By: Maude Emmer M.D. On: 11/20/2020 15:19          EKG:   EKG Interpretation Date/Time:  Monday April 19 2023 12:13:30 EST Ventricular Rate:  62 PR Interval:  144 QRS Duration:  80 QT Interval:  404 QTC Calculation: 410 R Axis:   70  Text Interpretation: Normal sinus rhythm Normal ECG When compared with ECG of 29-Sep-2022 11:59, No significant change was found Confirmed by Wonda Sharper [phone]) on 04/19/2023 12:30:04 PM    Recent Labs: No results found for requested labs within last 365 days.  Recent Lipid Panel    Component Value Date/Time   CHOL 128 08/20/2021 1443   TRIG 74 08/20/2021 1443   HDL 57 08/20/2021 1443   CHOLHDL 2.2 08/20/2021 1443   LDLCALC 56 08/20/2021 1443            Physical Exam:    VS:  BP (!) 130/90   Pulse 62   Ht 5' 7 (1.702 m)   Wt 171 lb  3.2 oz (77.7 kg)   SpO2 96%   BMI 26.81 kg/m     Wt Readings from Last 3 Encounters:  04/19/23 171 lb 3.2 oz (77.7 kg)  11/17/22 167 lb (75.8 kg)  09/29/22 167 lb 12.8 oz (76.1 kg)     GEN:  Well nourished, well developed in no acute distress HEENT: Normal NECK: No JVD; No carotid bruits LYMPHATICS: No lymphadenopathy CARDIAC: RRR, no murmurs, rubs, gallops RESPIRATORY:  Clear to auscultation without rales, wheezing or rhonchi  ABDOMEN: Soft, non-tender, non-distended MUSCULOSKELETAL:  No edema; No deformity  SKIN: Warm and dry NEUROLOGIC:  Alert and oriented x 3 PSYCHIATRIC:  Normal affect   Assessment & Plan Chest pain, unspecified type Unclear etiology.  Check high-sensitivity troponin.  Discussed potential treatment options.  Probably limited to analgesics with sublingual nitroglycerin , acetaminophen , and Toradol  as she is doing.  Continue Bystolic . Microvascular angina (HCC) Unfortunately has either failed or been intolerant to multiple classes.  Will try lisinopril  5 mg daily.  Continue aspirin  and high intensity statin drug.  I reviewed her evaluation from [facility] clinic.     Medication Adjustments/Labs and Tests Ordered: Current medicines are reviewed at length with the patient today.  Concerns regarding medicines are outlined above.  Orders Placed This Encounter  Procedures   Troponin T   EKG 12-Lead   Meds ordered this encounter  Medications   lisinopril  (ZESTRIL ) 10 MG tablet    Sig: Take 1 tablet (10 mg total) by mouth daily.    Dispense:  90 tablet    Refill:  3    Patient Instructions  Medication Instructions:  START Lisinopril  10mg  daily *If you need a refill on your cardiac medications before your next appointment, please call your pharmacy*  Lab Work: Troponin T today If you have labs (blood work) drawn today and your tests are completely normal, you will receive your results only by: MyChart Message (if you have MyChart) OR A paper copy in the  mail If you have any lab test that is abnormal or we need to change your treatment, we will call you to review the results.  Follow-Up: At [facility], you and your health needs are our priority.  As part of our continuing mission to provide you with exceptional heart care,  we have created designated Provider Care Teams.  These Care Teams include your primary Cardiologist (physician) and Advanced Practice Providers (APPs -  Physician Assistants and Nurse Practitioners) who all work together to provide you with the care you need, when you need it.  Your next appointment:   6 month(s)  Provider:   Ozell Fell, MD      Signed, Ozell Fell, MD  04/19/2023 1:37 PM    [facility]

## 2023-05-05 ENCOUNTER — Encounter: Payer: Self-pay | Admitting: Internal Medicine

## 2023-05-17 ENCOUNTER — Ambulatory Visit (INDEPENDENT_AMBULATORY_CARE_PROVIDER_SITE_OTHER): Payer: Medicare Other

## 2023-05-17 DIAGNOSIS — R002 Palpitations: Secondary | ICD-10-CM | POA: Diagnosis not present

## 2023-05-18 LAB — CUP PACEART REMOTE DEVICE CHECK
Date Time Interrogation Session: 20250209230539
Implantable Pulse Generator Implant Date: 20210315

## 2023-05-19 NOTE — Progress Notes (Signed)
[facility] Summary Report / Loop Recorder

## 2023-06-07 ENCOUNTER — Encounter: Payer: Self-pay | Admitting: Internal Medicine

## 2023-06-17 DIAGNOSIS — Z1231 Encounter for screening mammogram for malignant neoplasm of breast: Secondary | ICD-10-CM | POA: Diagnosis not present

## 2023-06-21 ENCOUNTER — Ambulatory Visit: Payer: Medicare Other

## 2023-06-21 DIAGNOSIS — R002 Palpitations: Secondary | ICD-10-CM | POA: Diagnosis not present

## 2023-06-21 NOTE — Addendum Note (Signed)
 Addended by: Geralyn Flash D on: 06/21/2023 01:32 PM   Modules accepted: Orders

## 2023-06-21 NOTE — Progress Notes (Signed)
 Carelink Summary Report / Loop Recorder

## 2023-06-22 LAB — CUP PACEART REMOTE DEVICE CHECK
Date Time Interrogation Session: 20250316230326
Implantable Pulse Generator Implant Date: 20210315

## 2023-06-25 ENCOUNTER — Encounter: Payer: Self-pay | Admitting: Cardiovascular Disease

## 2023-06-25 MED ORDER — LOSARTAN POTASSIUM 25 MG PO TABS: 25.0000 mg | ORAL_TABLET | Freq: Every day | ORAL | 3 refills | Status: DC

## 2023-07-06 DIAGNOSIS — E538 Deficiency of other specified B group vitamins: Secondary | ICD-10-CM | POA: Diagnosis not present

## 2023-07-06 DIAGNOSIS — Z79899 Other long term (current) drug therapy: Secondary | ICD-10-CM | POA: Diagnosis not present

## 2023-07-06 DIAGNOSIS — M549 Dorsalgia, unspecified: Secondary | ICD-10-CM | POA: Diagnosis not present

## 2023-07-06 DIAGNOSIS — R29898 Other symptoms and signs involving the musculoskeletal system: Secondary | ICD-10-CM | POA: Diagnosis not present

## 2023-07-06 DIAGNOSIS — E039 Hypothyroidism, unspecified: Secondary | ICD-10-CM | POA: Diagnosis not present

## 2023-07-06 DIAGNOSIS — Z6827 Body mass index (BMI) 27.0-27.9, adult: Secondary | ICD-10-CM | POA: Diagnosis not present

## 2023-07-06 DIAGNOSIS — E785 Hyperlipidemia, unspecified: Secondary | ICD-10-CM | POA: Diagnosis not present

## 2023-07-06 DIAGNOSIS — E673 Hypervitaminosis D: Secondary | ICD-10-CM | POA: Diagnosis not present

## 2023-07-07 LAB — LAB REPORT - SCANNED: EGFR: 75

## 2023-07-08 ENCOUNTER — Encounter: Payer: Self-pay | Admitting: Neurology

## 2023-07-12 ENCOUNTER — Other Ambulatory Visit (HOSPITAL_BASED_OUTPATIENT_CLINIC_OR_DEPARTMENT_OTHER): Payer: Self-pay | Admitting: Internal Medicine

## 2023-07-12 DIAGNOSIS — M549 Dorsalgia, unspecified: Secondary | ICD-10-CM

## 2023-07-12 DIAGNOSIS — R29898 Other symptoms and signs involving the musculoskeletal system: Secondary | ICD-10-CM

## 2023-07-16 ENCOUNTER — Ambulatory Visit (HOSPITAL_BASED_OUTPATIENT_CLINIC_OR_DEPARTMENT_OTHER)
Admission: RE | Admit: 2023-07-16 | Discharge: 2023-07-16 | Disposition: A | Discharge status: Home / Self Care | Source: Ambulatory Visit | Attending: Internal Medicine | Admitting: Internal Medicine

## 2023-07-16 ENCOUNTER — Ambulatory Visit (HOSPITAL_BASED_OUTPATIENT_CLINIC_OR_DEPARTMENT_OTHER)

## 2023-07-16 DIAGNOSIS — M549 Dorsalgia, unspecified: Secondary | ICD-10-CM | POA: Insufficient documentation

## 2023-07-16 DIAGNOSIS — R29898 Other symptoms and signs involving the musculoskeletal system: Secondary | ICD-10-CM | POA: Insufficient documentation

## 2023-07-17 ENCOUNTER — Encounter: Payer: Self-pay | Admitting: Internal Medicine

## 2023-07-19 ENCOUNTER — Encounter: Payer: Self-pay | Admitting: Neurology

## 2023-07-19 ENCOUNTER — Ambulatory Visit (INDEPENDENT_AMBULATORY_CARE_PROVIDER_SITE_OTHER): Admitting: Neurology

## 2023-07-19 VITALS — BP 121/69 | HR 63 | Ht 67.0 in | Wt 171.0 lb

## 2023-07-19 DIAGNOSIS — R29898 Other symptoms and signs involving the musculoskeletal system: Secondary | ICD-10-CM | POA: Diagnosis not present

## 2023-07-19 DIAGNOSIS — R202 Paresthesia of skin: Secondary | ICD-10-CM | POA: Diagnosis not present

## 2023-07-19 NOTE — Progress Notes (Signed)
 [facility] Neurology Division Clinic Note - Initial Visit   Date: 07/19/2023   JOELIE SCHOU MRN: [id number] DOB: [birth year]   Dear Dr. Sudie Bailey:  Thank you for your kind referral of Kayleigh Broadwell Roemer for consultation of leg weakness. Although her history is well known to you, please allow [place] to reiterate it for the purpose of our medical record. The patient was accompanied to the clinic by self.      Joury Allcorn Lindamood is a 73 y.o. right-handed female with hyperlipidemia, SVT, hypertension, hypothyroidism, CAD presenting for evaluation of leg weakness.   IMPRESSION/PLAN: Spells of bilateral leg weakness and numbness.  No signs of hyperreflexia or spasticity on exam to suggest myelopathy.  MRI lumbar spine has was completed on 4/11, however, results are not available.  We will contact radiology to see if we can get expedited review.  With exam showing arreflexia in the legs with absent vibration below the ankles, NCS/EMG of the legs will also be ordered.   Return to clinic in 2 months  ------------------------------------------------------------- History of present illness: Starting in January 2024, she began having heavy sensation of the legs with difficulty moving them.  She has 6 spells without any specific trigger.  Typically, she would try to lean onto something.  Most of the spells occurred while standing, such as washing dishes.  Another time, she had finished vacuuming the car and went to drive her car, and as she was driving she had the same sensation, so pulled to the side of the road.   She endorses low back tightness.  No shooting pain down the legs.  PCP ordered MRI lumbar spine which was completed on 4/11, however, results are not finalized.     Past Medical History:  Diagnosis Date   PSVT (paroxysmal supraventricular tachycardia) (HCC)    Thyroid disease    Vitamin D deficiency     Past Surgical History:  Procedure Laterality Date   LEFT HEART CATH AND CORONARY  ANGIOGRAPHY N/A 03/06/2019   Procedure: LEFT HEART CATH AND CORONARY ANGIOGRAPHY;  Surgeon: Lennette Bihari, MD;  Location: [facility] INVASIVE CV LAB;  Service: Cardiovascular;  Laterality: N/A;   stab phlebectomy Left 05/25/2016   > 20 incisions by Josephina Gip MD   stab phlebectomy Right 06/08/2016   stab phlebectomy > 20 incisions right leg by Josephina Gip MD    TOTAL ABDOMINAL HYSTERECTOMY  2019     Medications:  Outpatient Encounter Medications as of 07/19/2023  Medication Sig   aspirin EC 81 MG tablet Take 81 mg by mouth daily. Swallow whole.   atorvastatin (LIPITOR) 80 MG tablet TAKE 1 TABLET BY MOUTH EVERY DAY   cyanocobalamin (VITAMIN B12) 1000 MCG/ML injection every 14 (fourteen) days.   estradiol (ESTRACE) 0.1 MG/GM vaginal cream Place 1 Applicatorful vaginally as needed (dryness).   flecainide (TAMBOCOR) 50 MG tablet Take 1 tablet (50 mg total) by mouth 2 (two) times daily. (Patient taking differently: Take 50 mg by mouth 2 (two) times daily. Per patient taking 1/2 tablet twice a day)   losartan (COZAAR) 25 MG tablet Take 1 tablet (25 mg total) by mouth daily.   metFORMIN (GLUCOPHAGE-XR) 750 MG 24 hr tablet Take 1 tablet (750 mg total) by mouth 2 (two) times daily.   nebivolol (BYSTOLIC) 5 MG tablet Take 1 tablet (5 mg) in the morning and 0.5 tablet (2.5 mg) in the afternoon.   nitroGLYCERIN (NITROSTAT) 0.4 MG SL tablet PLACE 1 TABLET UNDER THE TONGUE EVERY 5 MINUTES AS  NEEDED FOR CHEST PAIN.   SYNTHROID 100 MCG tablet Take 100 mcg by mouth daily.   triamcinolone cream (KENALOG) 0.1 % Apply 1 Application topically 2 (two) times daily as needed.   [DISCONTINUED] ketorolac (TORADOL) 10 MG tablet Take 1 tablet (10 mg total) by mouth every 6 (six) hours as needed. (Patient not taking: Reported on 07/19/2023)   No facility-administered encounter medications on file as of 07/19/2023.    Allergies:  Allergies  Allergen Reactions   Tizanidine Swelling and Other (See Comments)    Dizziness,  tongue swelling    Family History: Family History  Family history unknown: Yes    Social History: Social History   Tobacco Use   Smoking status: Never   Smokeless tobacco: Never  Vaping Use   Vaping status: Never Used  Substance Use Topics   Alcohol use: Yes    Comment: occasional   Drug use: No   Social History   Social History Narrative   Are you right handed or left handed? Right Handed    Are you currently employed ? No    What is your current occupation? No    Do you live at home alone? No    Who lives with you? With husband    What type of home do you live in: 1 story or 2 story? Lives in a two story home         Vital Signs:  BP 121/69   Pulse 63   Ht 5\' 7"  (1.702 m)   Wt 171 lb (77.6 kg)   SpO2 96%   BMI 26.78 kg/m    Neurological Exam: MENTAL STATUS including orientation to time, place, person, recent and remote memory, attention span and concentration, language, and fund of knowledge is normal.  Speech is not dysarthric.  CRANIAL NERVES: II:  No visual field defects.     III-IV-VI: Pupils equal round and reactive to light.  Normal conjugate, extra-ocular eye movements in all directions of gaze.  No nystagmus.  No ptosis.   V:  Normal facial sensation.    VII:  Normal facial symmetry and movements.   VIII:  Normal hearing and vestibular function.   IX-X:  Normal palatal movement.   XI:  Normal shoulder shrug and head rotation.   XII:  Normal tongue strength and range of motion, no deviation or fasciculation.  MOTOR:  No atrophy, fasciculations or abnormal movements.  No pronator drift.   Upper Extremity:  Right  Left  Deltoid  5/5   5/5   Biceps  5/5   5/5   Triceps  5/5   5/5   Wrist extensors  5/5   5/5   Wrist flexors  5/5   5/5   Finger extensors  5/5   5/5   Finger flexors  5/5   5/5   Dorsal interossei  5/5   5/5   Abductor pollicis  5/5   5/5   Tone (Ashworth scale)  0  0   Lower Extremity:  Right  Left  Hip flexors  5/5   5/5    Hip extensors  5/5   5/5   Knee flexors  5/5   5/5   Knee extensors  5/5   5/5   Dorsiflexors  5/5   5/5   Plantarflexors  5/5   5/5   Toe extensors  5/5   5/5   Toe flexors  5/5   5/5   Tone (Ashworth scale)  0  0  MSRs:                                           Right        Left brachioradialis 2+  2+  biceps 2+  2+  triceps 2+  2+  patellar 0  0  ankle jerk 0  0  Hoffman no  no  plantar response down  down   SENSORY:  Normal and symmetric perception of light touch, pinprick and temperature.  Absent vibration below the ankles.  Mild sway with Rhomberg testing.  COORDINATION/GAIT: Normal finger-to- nose-finger.  Intact rapid alternating movements bilaterally.  Gait narrow based and stable.   Thank you for allowing me to participate in patient's care.  If I can answer any additional questions, I would be pleased to do so.    Sincerely,    Cortney Beissel K. Lydia Sams, DO

## 2023-07-19 NOTE — Patient Instructions (Signed)
 Please send a MyChart message once you have received your MRI results, so I can review them.  Nerve testing of the legs will be ordered.  ELECTROMYOGRAM AND NERVE CONDUCTION STUDIES (EMG/NCS) INSTRUCTIONS  How to Prepare The neurologist conducting the EMG will need to know if you have certain medical conditions. Tell the neurologist and other EMG lab personnel if you: Have a pacemaker or any other electrical medical device Take blood-thinning medications Have hemophilia, a blood-clotting disorder that causes prolonged bleeding Bathing Take a shower or bath shortly before your exam in order to remove oils from your skin. Don't apply lotions or creams before the exam.  What to Expect You'll likely be asked to change into a hospital gown for the procedure and lie down on an examination table. The following explanations can help you understand what will happen during the exam.  Electrodes. The neurologist or a technician places surface electrodes at various locations on your skin depending on where you're experiencing symptoms. Or the neurologist may insert needle electrodes at different sites depending on your symptoms.  Sensations. The electrodes will at times transmit a tiny electrical current that you may feel as a twinge or spasm. The needle electrode may cause discomfort or pain that usually ends shortly after the needle is removed. If you are concerned about discomfort or pain, you may want to talk to the neurologist about taking a short break during the exam.  Instructions. During the needle EMG, the neurologist will assess whether there is any spontaneous electrical activity when the muscle is at rest - activity that isn't present in healthy muscle tissue - and the degree of activity when you slightly contract the muscle.  He or she will give you instructions on resting and contracting a muscle at appropriate times. Depending on what muscles and nerves the neurologist is examining, he or she  may ask you to change positions during the exam.  After your EMG You may experience some temporary, minor bruising where the needle electrode was inserted into your muscle. This bruising should fade within several days. If it persists, contact your primary care doctor.

## 2023-07-26 ENCOUNTER — Ambulatory Visit: Payer: Medicare Other

## 2023-07-26 DIAGNOSIS — R002 Palpitations: Secondary | ICD-10-CM

## 2023-07-27 LAB — CUP PACEART REMOTE DEVICE CHECK
Date Time Interrogation Session: 20250420230121
Implantable Pulse Generator Implant Date: 20210315

## 2023-08-04 NOTE — Addendum Note (Signed)
 Addended by: Edra Govern D on: 08/04/2023 04:23 PM   Modules accepted: Orders

## 2023-08-04 NOTE — Progress Notes (Signed)
 Carelink Summary Report / Loop Recorder

## 2023-08-06 ENCOUNTER — Other Ambulatory Visit (HOSPITAL_BASED_OUTPATIENT_CLINIC_OR_DEPARTMENT_OTHER)

## 2023-08-12 ENCOUNTER — Encounter: Payer: Self-pay | Admitting: Neurology

## 2023-08-13 ENCOUNTER — Ambulatory Visit (INDEPENDENT_AMBULATORY_CARE_PROVIDER_SITE_OTHER): Admitting: Neurology

## 2023-08-13 DIAGNOSIS — R202 Paresthesia of skin: Secondary | ICD-10-CM | POA: Diagnosis not present

## 2023-08-13 NOTE — Telephone Encounter (Signed)
 Discussed findings in person at EMG visit.

## 2023-08-13 NOTE — Procedures (Signed)
 [facility] Neurology  [address] [place], [address]  [place], [place] [address] Tel: [phone] Fax: [phone] Test Date:  08/13/2023  Patient: Wanda Freeman DOB: [birth year] Physician: Reyna Cava, DO  Sex: Female Height: 5\' 7"  Ref Phys: Reyna Cava, DO  ID#: [id number]   Technician:    History: This is a 73 year old female referred for evaluation of episodic weakness of the legs.  NCV & EMG Findings: Electrodiagnostic testing of the right lower extremity and additional studies of the left shows: Bilateral sural and superficial peroneal sensory responses are within normal limits. Bilateral peroneal and tibial motor responses are within normal limits. Bilateral tibial H reflex studies are within normal limits. Chronic motor axonal loss changes are seen affecting the L5 myotome bilaterally, without accompanying active denervation.   Impression: Chronic L5 radiculopathy affecting bilateral lower extremities, mild. There is no evidence of a diffuse myopathy or sensorimotor polyneuropathy affecting the lower extremities.   ___________________________ Reyna Cava, DO    Nerve Conduction Studies   Stim Site NR Peak (ms) Norm Peak (ms) O-P Amp (V) Norm O-P Amp  Left Sup Peroneal Anti Sensory (Ant Lat Mall)  32 C  12 cm    2.8 <4.6 5.3 >3  Right Sup Peroneal Anti Sensory (Ant Lat Mall)  32 C  12 cm    2.2 <4.6 7.4 >3  Left Sural Anti Sensory (Lat Mall)  32 C  Calf    2.3 <4.6 8.4 >3  Right Sural Anti Sensory (Lat Mall)  32 C  Calf    2.4 <4.6 9.1 >3     Stim Site NR Onset (ms) Norm Onset (ms) O-P Amp (mV) Norm O-P Amp Site1 Site2 Delta-0 (ms) Dist (cm) Vel (m/s) Norm Vel (m/s)  Left Peroneal Motor (Ext Dig Brev)  32 C  Ankle    3.4 <6.0 7.7 >2.5 B Fib Ankle 7.8 37.0 47 >40  B Fib    11.2  7.1  Poplt B Fib 1.5 8.0 53 >40  Poplt    12.7  7.1         Right Peroneal Motor (Ext Dig Brev)  32 C  Ankle    2.7 <6.0 6.0 >2.5 B Fib Ankle 7.8 39.0 50 >40  B Fib    10.5   5.6  Poplt B Fib 1.6 9.0 56 >40  Poplt    12.1  5.5         Left Tibial Motor (Abd Hall Brev)  32 C  Ankle    2.9 <6.0 8.7 >4 Knee Ankle 9.3 41.0 44 >40  Knee    12.2  7.4         Right Tibial Motor (Abd Hall Brev)  32 C  Ankle    3.6 <6.0 8.2 >4 Knee Ankle 8.3 43.0 52 >40  Knee    11.9  6.2          Electromyography   Side Muscle Ins.Act Fibs Fasc Recrt Amp Dur Poly Activation Comment  Right AntTibialis Nml Nml Nml *1- *1+ *1+ *1+ Nml N/A  Right Gastroc Nml Nml Nml Nml Nml Nml Nml Nml N/A  Right Flex Dig Long Nml Nml Nml *1- *1+ *1+ *1+ Nml N/A  Right RectFemoris Nml Nml Nml Nml Nml Nml Nml Nml N/A  Right GluteusMed Nml Nml Nml *1- *1+ *1+ *1+ Nml N/A  Left AntTibialis Nml Nml Nml *1- *1+ *1+ *1+ Nml N/A  Left Gastroc Nml Nml Nml Nml Nml Nml Nml Nml N/A  Left Flex  Dig Long Nml Nml Nml *1- *1+ *1+ *1+ Nml N/A  Left RectFemoris Nml Nml Nml Nml Nml Nml Nml Nml N/A  Left GluteusMed Nml Nml Nml *1- *1+ *1+ *1+ Nml N/A      Waveforms:

## 2023-08-15 ENCOUNTER — Encounter: Payer: Self-pay | Admitting: Neurology

## 2023-08-17 ENCOUNTER — Ambulatory Visit: Admitting: Neurology

## 2023-08-18 ENCOUNTER — Other Ambulatory Visit: Payer: Self-pay

## 2023-08-18 DIAGNOSIS — R202 Paresthesia of skin: Secondary | ICD-10-CM

## 2023-08-18 DIAGNOSIS — R29898 Other symptoms and signs involving the musculoskeletal system: Secondary | ICD-10-CM

## 2023-08-27 ENCOUNTER — Telehealth: Payer: Self-pay | Admitting: Neurology

## 2023-08-27 NOTE — Telephone Encounter (Signed)
 Pt called in stating the [facility] never got a referral. She would like it sent again. Their fax number is [phone].

## 2023-08-31 ENCOUNTER — Ambulatory Visit (INDEPENDENT_AMBULATORY_CARE_PROVIDER_SITE_OTHER): Payer: Medicare Other

## 2023-08-31 DIAGNOSIS — R002 Palpitations: Secondary | ICD-10-CM | POA: Diagnosis not present

## 2023-08-31 LAB — CUP PACEART REMOTE DEVICE CHECK
Date Time Interrogation Session: 20250526230023
Implantable Pulse Generator Implant Date: 20210315

## 2023-09-01 ENCOUNTER — Ambulatory Visit: Payer: Self-pay | Admitting: Cardiology

## 2023-09-01 NOTE — Telephone Encounter (Signed)
 Called patient and left a detailed message per DPR that referral has been re faxed. Also informed patient to give us  a call if she has an issues and our contact information has been left.

## 2023-09-10 NOTE — Progress Notes (Signed)
 Carelink Summary Report / Loop Recorder

## 2023-09-10 NOTE — Addendum Note (Signed)
 Addended by: Edra Govern D on: 09/10/2023 03:16 PM   Modules accepted: Orders

## 2023-09-15 ENCOUNTER — Other Ambulatory Visit: Payer: Self-pay | Admitting: Internal Medicine

## 2023-09-20 ENCOUNTER — Ambulatory Visit: Admitting: Neurology

## 2023-09-26 ENCOUNTER — Other Ambulatory Visit: Payer: Self-pay | Admitting: Cardiovascular Disease

## 2023-09-27 ENCOUNTER — Other Ambulatory Visit: Payer: Self-pay | Admitting: Internal Medicine

## 2023-09-27 DIAGNOSIS — I2089 Other forms of angina pectoris: Secondary | ICD-10-CM

## 2023-09-29 NOTE — Telephone Encounter (Signed)
 Pt of Dr. Fernande. Does Dr. Fernande want to refill this non Cardiac RX? Please advise.

## 2023-09-30 ENCOUNTER — Ambulatory Visit (INDEPENDENT_AMBULATORY_CARE_PROVIDER_SITE_OTHER)

## 2023-09-30 DIAGNOSIS — I480 Paroxysmal atrial fibrillation: Secondary | ICD-10-CM

## 2023-09-30 LAB — CUP PACEART REMOTE DEVICE CHECK
Date Time Interrogation Session: 20250625230326
Implantable Pulse Generator Implant Date: 20210315

## 2023-10-03 ENCOUNTER — Ambulatory Visit: Payer: Self-pay | Admitting: Cardiology

## 2023-10-08 ENCOUNTER — Other Ambulatory Visit: Payer: Self-pay | Admitting: Internal Medicine

## 2023-10-18 NOTE — Progress Notes (Signed)
 Carelink Summary Report / Loop Recorder

## 2023-11-01 ENCOUNTER — Ambulatory Visit

## 2023-11-01 DIAGNOSIS — I480 Paroxysmal atrial fibrillation: Secondary | ICD-10-CM

## 2023-11-02 ENCOUNTER — Ambulatory Visit: Admitting: Student

## 2023-11-02 LAB — CUP PACEART REMOTE DEVICE CHECK
Date Time Interrogation Session: 20250727230207
Implantable Pulse Generator Implant Date: 20210315

## 2023-11-07 ENCOUNTER — Ambulatory Visit: Payer: Self-pay | Admitting: Cardiology

## 2023-11-09 DIAGNOSIS — M6281 Muscle weakness (generalized): Secondary | ICD-10-CM | POA: Diagnosis not present

## 2023-11-09 DIAGNOSIS — M5440 Lumbago with sciatica, unspecified side: Secondary | ICD-10-CM | POA: Diagnosis not present

## 2023-11-09 DIAGNOSIS — M2560 Stiffness of unspecified joint, not elsewhere classified: Secondary | ICD-10-CM | POA: Diagnosis not present

## 2023-11-14 ENCOUNTER — Other Ambulatory Visit: Payer: Self-pay | Admitting: Cardiovascular Disease

## 2023-11-15 ENCOUNTER — Encounter: Payer: Self-pay | Admitting: Cardiovascular Disease

## 2023-11-15 MED ORDER — FLECAINIDE ACETATE 50 MG PO TABS: 50.0000 mg | ORAL_TABLET | Freq: Two times a day (BID) | ORAL | 1 refills | Status: DC

## 2023-11-16 DIAGNOSIS — M5440 Lumbago with sciatica, unspecified side: Secondary | ICD-10-CM | POA: Diagnosis not present

## 2023-11-16 DIAGNOSIS — M6281 Muscle weakness (generalized): Secondary | ICD-10-CM | POA: Diagnosis not present

## 2023-11-16 DIAGNOSIS — M2560 Stiffness of unspecified joint, not elsewhere classified: Secondary | ICD-10-CM | POA: Diagnosis not present

## 2023-11-18 DIAGNOSIS — M5440 Lumbago with sciatica, unspecified side: Secondary | ICD-10-CM | POA: Diagnosis not present

## 2023-11-18 DIAGNOSIS — M2560 Stiffness of unspecified joint, not elsewhere classified: Secondary | ICD-10-CM | POA: Diagnosis not present

## 2023-11-18 DIAGNOSIS — M6281 Muscle weakness (generalized): Secondary | ICD-10-CM | POA: Diagnosis not present

## 2023-11-23 DIAGNOSIS — M2560 Stiffness of unspecified joint, not elsewhere classified: Secondary | ICD-10-CM | POA: Diagnosis not present

## 2023-11-23 DIAGNOSIS — M5440 Lumbago with sciatica, unspecified side: Secondary | ICD-10-CM | POA: Diagnosis not present

## 2023-11-23 DIAGNOSIS — M6281 Muscle weakness (generalized): Secondary | ICD-10-CM | POA: Diagnosis not present

## 2023-11-27 ENCOUNTER — Encounter (HOSPITAL_COMMUNITY): Payer: Self-pay | Admitting: Emergency Medicine

## 2023-11-27 ENCOUNTER — Emergency Department (HOSPITAL_COMMUNITY)
Admission: EM | Admit: 2023-11-27 | Discharge: 2023-11-28 | Disposition: A | Discharge status: Home / Self Care | Attending: Emergency Medicine | Admitting: Emergency Medicine

## 2023-11-27 ENCOUNTER — Emergency Department (HOSPITAL_COMMUNITY)

## 2023-11-27 DIAGNOSIS — M5136 Other intervertebral disc degeneration, lumbar region with discogenic back pain only: Secondary | ICD-10-CM | POA: Diagnosis not present

## 2023-11-27 DIAGNOSIS — M5459 Other low back pain: Secondary | ICD-10-CM | POA: Diagnosis not present

## 2023-11-27 DIAGNOSIS — R3129 Other microscopic hematuria: Secondary | ICD-10-CM | POA: Diagnosis not present

## 2023-11-27 DIAGNOSIS — R319 Hematuria, unspecified: Secondary | ICD-10-CM | POA: Diagnosis not present

## 2023-11-27 DIAGNOSIS — Z7982 Long term (current) use of aspirin: Secondary | ICD-10-CM | POA: Diagnosis not present

## 2023-11-27 DIAGNOSIS — M533 Sacrococcygeal disorders, not elsewhere classified: Secondary | ICD-10-CM | POA: Insufficient documentation

## 2023-11-27 DIAGNOSIS — M48061 Spinal stenosis, lumbar region without neurogenic claudication: Secondary | ICD-10-CM | POA: Diagnosis not present

## 2023-11-27 DIAGNOSIS — M545 Low back pain, unspecified: Secondary | ICD-10-CM

## 2023-11-27 MED ORDER — OXYCODONE-ACETAMINOPHEN 5-325 MG PO TABS
1.0000 | ORAL_TABLET | Freq: Once | ORAL | Status: AC
  Administered 2023-11-27: 1 via ORAL
  Filled 2023-11-27: qty 1

## 2023-11-27 NOTE — ED Triage Notes (Signed)
 Pt here from home with c/o lower back pain upon waking this morning has tried heat and some tylenol  with minimal relief

## 2023-11-28 ENCOUNTER — Ambulatory Visit (HOSPITAL_COMMUNITY)

## 2023-11-28 ENCOUNTER — Emergency Department (HOSPITAL_COMMUNITY)

## 2023-11-28 DIAGNOSIS — M533 Sacrococcygeal disorders, not elsewhere classified: Secondary | ICD-10-CM | POA: Diagnosis not present

## 2023-11-28 LAB — URINALYSIS, ROUTINE W REFLEX MICROSCOPIC
Bacteria, UA: NONE SEEN
Bilirubin Urine: NEGATIVE
Glucose, UA: NEGATIVE mg/dL
Ketones, ur: NEGATIVE mg/dL
Nitrite: NEGATIVE
Protein, ur: NEGATIVE mg/dL
Specific Gravity, Urine: 1.004 — ABNORMAL LOW (ref 1.005–1.030)
pH: 6 (ref 5.0–8.0)

## 2023-11-28 MED ORDER — OXYCODONE-ACETAMINOPHEN 5-325 MG PO TABS
1.0000 | ORAL_TABLET | ORAL | Status: AC
  Administered 2023-11-28: 1 via ORAL
  Filled 2023-11-28: qty 1

## 2023-11-28 MED ORDER — LIDOCAINE 5 % EX PTCH: 1.0000 | MEDICATED_PATCH | CUTANEOUS | 0 refills | Status: DC

## 2023-11-28 MED ORDER — LIDOCAINE 5 % EX PTCH
3.0000 | MEDICATED_PATCH | CUTANEOUS | Status: DC
  Administered 2023-11-28: 3 via TRANSDERMAL
  Filled 2023-11-28: qty 3

## 2023-11-28 MED ORDER — PREDNISONE 20 MG PO TABS: ORAL_TABLET | ORAL | 0 refills | Status: AC

## 2023-11-28 MED ORDER — KETOROLAC TROMETHAMINE 60 MG/2ML IM SOLN
30.0000 mg | Freq: Once | INTRAMUSCULAR | Status: AC
  Administered 2023-11-28: 30 mg via INTRAMUSCULAR
  Filled 2023-11-28: qty 2

## 2023-11-28 NOTE — ED Notes (Signed)
 Pt does not want CT, provider notified and she will come talk to pt.

## 2023-11-28 NOTE — ED Provider Notes (Addendum)
 Patient was placed in hallway.  Family was apparently not happy with this or the wait times.  Urine was not ordered in triage but given location of pain I ordered a urine to exclude UTI.  Patient's presentation was not consistent with kidney stone but when urine returned there was blood in the urine and CT renal was ordered by me. Informed by nurse patient was refusing CT.  Went to speak with patient to explain my rationale but patient again refused stating she thought she had ongoing blood in the urine.    Patient requested another percocet at discharge and an RX.  I have provided one additional percocet in the ED but given Patient is on xanax  and given the risk of falling I will not be prescribing opioids.  Moreover as patient has refused testing I am not treating for a stone at this point.  Were this a kidney stone I would be treating with several medications.  After discharge, nursing informed me that the husband of the patient was up at the desk frequently complaining and identifying himself as a physician.  I was not informed of the frequent complaints.        Thomson Herbers, MD 11/28/23 2302

## 2023-11-28 NOTE — ED Notes (Signed)
Patient verbalizes understanding of discharge instructions. Opportunity for questioning and answers were provided. Armband removed by staff, pt discharged from ED. Wheeled out to car with husband

## 2023-11-28 NOTE — ED Provider Notes (Addendum)
 [place] EMERGENCY DEPARTMENT AT [facility] Provider Note   CSN: [id number] Arrival date & time: 11/27/23  1831     Patient presents with: Back Pain   Wanda Freeman is a 73 y.o. female.   The history is provided by the patient.  Back Pain Location:  Sacro-iliac joint Radiates to:  Does not radiate Pain severity:  Severe Pain is:  Same all the time Onset quality:  Sudden Duration:  1 day Timing:  Constant Progression:  Unchanged Chronicity:  New Context: not falling, not lifting heavy objects, not MCA and not MVA   Relieved by:  Nothing Worsened by:  Nothing Ineffective treatments:  OTC medications Associated symptoms: no abdominal swelling, no bladder incontinence, no bowel incontinence, no dysuria, no fever, no perianal numbness and no weakness   Risk factors: no hx of cancer   Patient with a history of thyroid  disease presents with low back pain x 1 day.  Patient awoke with this patient and denies heavy lifting or any trauma.  No emesis.  Patient denies urinary symptoms.   Past Medical History:  Diagnosis Date   PSVT (paroxysmal supraventricular tachycardia) (HCC)    Thyroid  disease    Vitamin D deficiency        Prior to Admission medications   Medication Sig Start Date End Date Taking? Authorizing Provider  lidocaine  (LIDODERM ) 5 % Place 1 patch onto the skin daily. Remove & Discard patch within 12 hours or as directed by MD 11/28/23  Yes Romesha Scherer, MD  predniSONE  (DELTASONE ) 20 MG tablet 3 tabs po day one, then 2 po daily x 4 days 11/28/23  Yes Sarye Kath, MD  aspirin  EC 81 MG tablet Take 81 mg by mouth daily. Swallow whole.    [provider]  atorvastatin  (LIPITOR) 80 MG tablet TAKE 1 TABLET BY MOUTH EVERY DAY 09/29/23   Wonda Sharper, MD  cyanocobalamin  (VITAMIN B12) 1000 MCG/ML injection every 14 (fourteen) days. 10/20/22   [provider]  estradiol (ESTRACE) 0.1 MG/GM vaginal cream Place 1 Applicatorful vaginally as  needed (dryness). 09/16/19   [provider]  flecainide  (TAMBOCOR ) 50 MG tablet Take 1 tablet (50 mg total) by mouth 2 (two) times daily. Or as directed 11/15/23   Wonda Sharper, MD  losartan  (COZAAR ) 25 MG tablet Take 1 tablet (25 mg total) by mouth daily. 06/25/23   Nahser, Aleene PARAS, MD  metFORMIN  (GLUCOPHAGE -XR) 750 MG 24 hr tablet Take 1 tablet (750 mg total) by mouth 2 (two) times daily. 09/30/23   Lesia Sharper Barter, PA-C  nebivolol  (BYSTOLIC ) 5 MG tablet TAKE 1 TABLET BY MOUTH DAILY. PT. MUST MAKE AN APPOINTMENT IN ORDER TO RECEIVE FUTURE REFILLS. 10/11/23   Fernande Elspeth BROCKS, MD  nitroGLYCERIN  (NITROSTAT ) 0.4 MG SL tablet PLACE 1 TABLET UNDER THE TONGUE EVERY 5 MINUTES AS NEEDED FOR CHEST PAIN. 09/14/22   Fernande Elspeth BROCKS, MD  SYNTHROID 100 MCG tablet Take 100 mcg by mouth daily. 03/15/19   [provider]  triamcinolone cream (KENALOG) 0.1 % Apply 1 Application topically 2 (two) times daily as needed. 10/20/22   [provider]    Allergies: Tizanidine    Review of Systems  Constitutional:  Negative for fever.  Gastrointestinal:  Negative for bowel incontinence.  Genitourinary:  Negative for bladder incontinence, difficulty urinating and dysuria.  Musculoskeletal:  Positive for back pain.  Neurological:  Negative for weakness.  All other systems reviewed and are negative.   Updated Vital Signs BP (!) 125/58 (  BP Location: Left Arm)   Pulse (!) 57   Temp 97.8 F (36.6 C) (Oral)   Resp 16   SpO2 99%   Physical Exam Vitals and nursing note reviewed.  Constitutional:      General: She is not in acute distress.    Appearance: Normal appearance. She is well-developed. She is not ill-appearing or toxic-appearing.  HENT:     Head: Normocephalic and atraumatic.     Nose: Nose normal.  Eyes:     Pupils: Pupils are equal, round, and reactive to light.  Cardiovascular:     Rate and Rhythm: Normal rate and regular rhythm.     Pulses: Normal pulses.      Heart sounds: Normal heart sounds.  Pulmonary:     Effort: Pulmonary effort is normal. No respiratory distress.     Breath sounds: Normal breath sounds.  Abdominal:     General: Bowel sounds are normal. There is no distension.     Palpations: Abdomen is soft.     Tenderness: There is no abdominal tenderness. There is no guarding or rebound.  Musculoskeletal:        General: Normal range of motion.     Cervical back: Normal range of motion.     Thoracic back: Normal.     Lumbar back: Normal.       Back:  Skin:    General: Skin is warm and dry.     Capillary Refill: Capillary refill takes less than 2 seconds.     Findings: No erythema or rash.  Neurological:     General: No focal deficit present.     Mental Status: She is alert.     Deep Tendon Reflexes: Reflexes normal.  Psychiatric:        Thought Content: Thought content normal.     (all labs ordered are listed, but only abnormal results are displayed) Labs Reviewed  URINALYSIS, ROUTINE W REFLEX MICROSCOPIC - Abnormal; Notable for the following components:      Result Value   Color, Urine STRAW (*)    Specific Gravity, Urine 1.004 (*)    Hgb urine dipstick SMALL (*)    Leukocytes,Ua SMALL (*)    All other components within normal limits    EKG: None  Radiology: DG Lumbar Spine Complete Result Date: 11/27/2023 CLINICAL DATA:  Pain. EXAM: LUMBAR SPINE - COMPLETE 4+ VIEW COMPARISON:  None Available. FINDINGS: There is no evidence of lumbar spine fracture. Alignment is normal. Marked severity endplate sclerosis, mild to moderate severity anterior osteophyte formation and posterior bony spurring are seen at the levels of L1-L2, L2-L3 and L3-L4 and L5-S1. Moderate severity multilevel intervertebral disc space narrowing is present throughout the lumbar spine. IMPRESSION: Moderate to marked severity multilevel degenerative disc disease. Electronically Signed   By: Suzen Dials M.D.   On: 11/27/2023 19:26     Procedures    Medications Ordered in the ED  lidocaine  (LIDODERM ) 5 % 3 patch (3 patches Transdermal Patch Applied 11/28/23 0141)  oxyCODONE -acetaminophen  (PERCOCET/ROXICET) 5-325 MG per tablet 1 tablet (1 tablet Oral Given 11/27/23 1851)  ketorolac  (TORADOL ) injection 30 mg (30 mg Intramuscular Given 11/28/23 0141)                                    Medical Decision Making Low back pain left just above iliac rim  Amount and/or Complexity of Data Reviewed External Data Reviewed: radiology and notes.  Details: MRI reviewed and previous notes reviewed  Labs: ordered.    Details: No UTI, microscopic hematuria, not consistent with UTI Radiology: ordered and independent interpretation performed.    Details: No fractures   Risk Prescription drug management. Risk Details: Differential: muscle strain vs spasm; kidney stone, UTI. No bony abnormalities on xray.  Given hematuria upon completion of urinalysis, CT renal was ordered.  Patient declined this test.  Patient reports she believes she has a history of microscopic hematuria.  I cannot find a history of this in the records, I will refer to urology for ongoing diagnosis and treatment of microscopic hematuria.  I will treat for Muscle pain. Stable for discharge with close follow up.       Final diagnoses:  Acute left-sided low back pain without sciatica  Microscopic hematuria   No signs of systemic illness or infection. The patient is nontoxic-appearing on exam and vital signs are within normal limits.  I have reviewed the triage vital signs and the nursing notes. Pertinent labs & imaging results that were available during my care of the patient were reviewed by me and considered in my medical decision making (see chart for details). After history, exam, and medical workup I feel the patient has been appropriately medically screened and is safe for discharge home. Pertinent diagnoses were discussed with the patient. Patient was given return  precautions.  ED Discharge Orders          Ordered    predniSONE  (DELTASONE ) 20 MG tablet        11/28/23 0307    lidocaine  (LIDODERM ) 5 %  Every 24 hours        11/28/23 0307              Geovani Tootle, MD 11/28/23 2307

## 2023-11-29 ENCOUNTER — Other Ambulatory Visit: Payer: Self-pay | Admitting: Internal Medicine

## 2023-11-30 DIAGNOSIS — M549 Dorsalgia, unspecified: Secondary | ICD-10-CM | POA: Diagnosis not present

## 2023-11-30 DIAGNOSIS — Z6827 Body mass index (BMI) 27.0-27.9, adult: Secondary | ICD-10-CM | POA: Diagnosis not present

## 2023-11-30 DIAGNOSIS — M48061 Spinal stenosis, lumbar region without neurogenic claudication: Secondary | ICD-10-CM | POA: Diagnosis not present

## 2023-11-30 NOTE — Telephone Encounter (Signed)
Please contact pt for future appointment. Pt overdue for follow up.

## 2023-12-01 ENCOUNTER — Encounter: Payer: Self-pay | Admitting: Cardiovascular Disease

## 2023-12-01 ENCOUNTER — Other Ambulatory Visit: Payer: Self-pay | Admitting: Internal Medicine

## 2023-12-01 DIAGNOSIS — M549 Dorsalgia, unspecified: Secondary | ICD-10-CM

## 2023-12-01 MED ORDER — NITROGLYCERIN 0.4 MG SL SUBL: 0.4000 mg | SUBLINGUAL_TABLET | SUBLINGUAL | 0 refills | Status: AC | PRN

## 2023-12-02 ENCOUNTER — Ambulatory Visit

## 2023-12-02 DIAGNOSIS — R002 Palpitations: Secondary | ICD-10-CM

## 2023-12-02 LAB — CUP PACEART REMOTE DEVICE CHECK
Date Time Interrogation Session: 20250827230916
Implantable Pulse Generator Implant Date: 20210315

## 2023-12-07 ENCOUNTER — Other Ambulatory Visit (HOSPITAL_BASED_OUTPATIENT_CLINIC_OR_DEPARTMENT_OTHER): Payer: Self-pay | Admitting: Internal Medicine

## 2023-12-07 DIAGNOSIS — M549 Dorsalgia, unspecified: Secondary | ICD-10-CM

## 2023-12-08 ENCOUNTER — Other Ambulatory Visit: Payer: Self-pay | Admitting: Cardiovascular Disease

## 2023-12-10 ENCOUNTER — Encounter

## 2023-12-12 ENCOUNTER — Ambulatory Visit: Payer: Self-pay | Admitting: Cardiology

## 2023-12-14 DIAGNOSIS — Z1331 Encounter for screening for depression: Secondary | ICD-10-CM | POA: Diagnosis not present

## 2023-12-14 DIAGNOSIS — I25119 Atherosclerotic heart disease of native coronary artery with unspecified angina pectoris: Secondary | ICD-10-CM | POA: Diagnosis not present

## 2023-12-14 DIAGNOSIS — Z6827 Body mass index (BMI) 27.0-27.9, adult: Secondary | ICD-10-CM | POA: Diagnosis not present

## 2023-12-14 DIAGNOSIS — E039 Hypothyroidism, unspecified: Secondary | ICD-10-CM | POA: Diagnosis not present

## 2023-12-14 DIAGNOSIS — E785 Hyperlipidemia, unspecified: Secondary | ICD-10-CM | POA: Diagnosis not present

## 2023-12-14 DIAGNOSIS — Z0000 Encounter for general adult medical examination without abnormal findings: Secondary | ICD-10-CM | POA: Diagnosis not present

## 2023-12-14 DIAGNOSIS — F419 Anxiety disorder, unspecified: Secondary | ICD-10-CM | POA: Diagnosis not present

## 2023-12-14 NOTE — Progress Notes (Signed)
 Carelink Summary Report / Loop Recorder

## 2023-12-16 NOTE — Progress Notes (Signed)
 Remote Loop Recorder Transmission

## 2023-12-21 DIAGNOSIS — E785 Hyperlipidemia, unspecified: Secondary | ICD-10-CM | POA: Diagnosis not present

## 2023-12-21 DIAGNOSIS — Z79899 Other long term (current) drug therapy: Secondary | ICD-10-CM | POA: Diagnosis not present

## 2023-12-21 DIAGNOSIS — E559 Vitamin D deficiency, unspecified: Secondary | ICD-10-CM | POA: Diagnosis not present

## 2023-12-21 DIAGNOSIS — E538 Deficiency of other specified B group vitamins: Secondary | ICD-10-CM | POA: Diagnosis not present

## 2023-12-21 DIAGNOSIS — E039 Hypothyroidism, unspecified: Secondary | ICD-10-CM | POA: Diagnosis not present

## 2023-12-23 DIAGNOSIS — Z23 Encounter for immunization: Secondary | ICD-10-CM | POA: Diagnosis not present

## 2023-12-26 ENCOUNTER — Other Ambulatory Visit: Payer: Self-pay | Admitting: Student

## 2023-12-26 DIAGNOSIS — I2089 Other forms of angina pectoris: Secondary | ICD-10-CM

## 2024-01-03 ENCOUNTER — Ambulatory Visit

## 2024-01-03 DIAGNOSIS — E538 Deficiency of other specified B group vitamins: Secondary | ICD-10-CM | POA: Diagnosis not present

## 2024-01-03 DIAGNOSIS — I480 Paroxysmal atrial fibrillation: Secondary | ICD-10-CM | POA: Diagnosis not present

## 2024-01-03 DIAGNOSIS — Z79899 Other long term (current) drug therapy: Secondary | ICD-10-CM | POA: Diagnosis not present

## 2024-01-03 DIAGNOSIS — I491 Atrial premature depolarization: Secondary | ICD-10-CM | POA: Diagnosis not present

## 2024-01-03 DIAGNOSIS — E785 Hyperlipidemia, unspecified: Secondary | ICD-10-CM | POA: Diagnosis not present

## 2024-01-03 LAB — CUP PACEART REMOTE DEVICE CHECK
Date Time Interrogation Session: 20250928230519
Implantable Pulse Generator Implant Date: 20210315

## 2024-01-04 ENCOUNTER — Ambulatory Visit: Attending: Cardiovascular Disease | Admitting: Cardiovascular Disease

## 2024-01-04 ENCOUNTER — Encounter: Payer: Self-pay | Admitting: Cardiovascular Disease

## 2024-01-04 VITALS — BP 122/68 | HR 67 | Ht 67.0 in | Wt 171.8 lb

## 2024-01-04 DIAGNOSIS — I2089 Other forms of angina pectoris: Secondary | ICD-10-CM | POA: Insufficient documentation

## 2024-01-04 DIAGNOSIS — R002 Palpitations: Secondary | ICD-10-CM | POA: Insufficient documentation

## 2024-01-04 DIAGNOSIS — Z23 Encounter for immunization: Secondary | ICD-10-CM | POA: Diagnosis not present

## 2024-01-04 MED ORDER — METFORMIN HCL ER 750 MG PO TB24
750.0000 mg | ORAL_TABLET | Freq: Two times a day (BID) | ORAL | 3 refills | Status: AC
Start: 2024-01-04 — End: ?

## 2024-01-04 MED ORDER — NEBIVOLOL HCL 5 MG PO TABS: 5.0000 mg | ORAL_TABLET | Freq: Every day | ORAL | 3 refills | Status: AC

## 2024-01-04 MED ORDER — RANOLAZINE ER 500 MG PO TB12: 500.0000 mg | ORAL_TABLET | Freq: Two times a day (BID) | ORAL | 11 refills | Status: AC

## 2024-01-04 MED ORDER — METFORMIN HCL ER 750 MG PO TB24: 750.0000 mg | ORAL_TABLET | Freq: Two times a day (BID) | ORAL | 3 refills | Status: DC

## 2024-01-04 NOTE — Assessment & Plan Note (Signed)
 SVT controlled on low-dose flecainide  and Nebivolol .  Continue the same.

## 2024-01-04 NOTE — Patient Instructions (Signed)
 Medication Instructions:  START Ranolazine  (Ranexa ) 500 mg twice daily   *If you need a refill on your cardiac medications before your next appointment, please call your pharmacy*  Lab Work: None ordered today. If you have labs (blood work) drawn today and your tests are completely normal, you will receive your results only by: MyChart Message (if you have MyChart) OR A paper copy in the mail If you have any lab test that is abnormal or we need to change your treatment, we will call you to review the results.  Testing/Procedures: Your physician has requested that you have an echocardiogram. Echocardiography is a painless test that uses sound waves to create images of your heart. It provides your doctor with information about the size and shape of your heart and how well your heart's chambers and valves are working. This procedure takes approximately one hour. There are no restrictions for this procedure. Please do NOT wear cologne, perfume, aftershave, or lotions (deodorant is allowed). Please arrive 15 minutes prior to your appointment time.  Please note: We ask at that you not bring children with you during ultrasound (echo/ vascular) testing. Due to room size and safety concerns, children are not allowed in the ultrasound rooms during exams. Our front office staff cannot provide observation of children in our lobby area while testing is being conducted. An adult accompanying a patient to their appointment will only be allowed in the ultrasound room at the discretion of the ultrasound technician under special circumstances. We apologize for any inconvenience.   Follow-Up: At [facility], you and your health needs are our priority.  As part of our continuing mission to provide you with exceptional heart care, our providers are all part of one team.  This team includes your primary Cardiologist (physician) and Advanced Practice Providers or APPs (Physician Assistants and Nurse  Practitioners) who all work together to provide you with the care you need, when you need it.  Your next appointment:   6 month(s)  Provider:   Ozell Fell, MD

## 2024-01-04 NOTE — Progress Notes (Signed)
 Cardiology Office Note:    Date:  01/04/2024   ID:  Wanda Freeman, DOB [birth year], MRN [id number]  PCP:  Wanda Fitch, MD   [facility] Providers Cardiologist:  None     Referring MD: Wanda Fitch, MD   Chief Complaint  Patient presents with   Chest Pain    History of Present Illness:    Wanda Freeman is a 73 y.o. female presenting for follow-up of microvascular angina.  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  She has had positive exercise EKG testing.  She has been intolerant to calcium  channel blockers and long-acting nitrates.  She did not benefit from Ranexa .  The patient is here with her husband today.  She has been having frequent, daily chest pain.  Her pain occurs at rest and with activity.  She has an aching discomfort across the chest in the center and left side.  There is no shortness of breath or heart palpitations.  She has had longstanding frequent chest pain symptoms and as outlined above, has failed multiple agents due to intolerances.  She was tried again on low-dose losartan  and had marked fatigue with this.  She has previously been intolerant to ranolazine  due to constipation, and hypotension with long-acting nitrates and calcium  channel blockers.  Current Medications: Current Meds  Medication Sig   aspirin  EC 81 MG tablet Take 81 mg by mouth daily. Swallow whole.   atorvastatin  (LIPITOR) 80 MG tablet TAKE 1 TABLET BY MOUTH EVERY DAY   cyanocobalamin  (VITAMIN B12) 1000 MCG/ML injection every 14 (fourteen) days.   estradiol (ESTRACE) 0.1 MG/GM vaginal cream Place 1 Applicatorful vaginally as needed (dryness).   flecainide  (TAMBOCOR ) 50 MG tablet Take 1 tablet (50 mg total) by mouth 2 (two) times daily. Or as directed   nitroGLYCERIN  (NITROSTAT ) 0.4 MG SL tablet PLACE 1 TABLET UNDER THE TONGUE EVERY 5 MINUTES AS NEEDED FOR CHEST PAIN.   nitroGLYCERIN   (NITROSTAT ) 0.4 MG SL tablet Place 1 tablet (0.4 mg total) under the tongue every 5 (five) minutes as needed for chest pain.   predniSONE  (DELTASONE ) 20 MG tablet 3 tabs po day one, then 2 po daily x 4 days   ranolazine  (RANEXA ) 500 MG 12 hr tablet Take 1 tablet (500 mg total) by mouth 2 (two) times daily.   SYNTHROID 100 MCG tablet Take 100 mcg by mouth daily.   triamcinolone cream (KENALOG) 0.1 % Apply 1 Application topically 2 (two) times daily as needed.   [DISCONTINUED] metFORMIN  (GLUCOPHAGE -XR) 750 MG 24 hr tablet Take 1 tablet (750 mg total) by mouth 2 (two) times daily.   [DISCONTINUED] nebivolol  (BYSTOLIC ) 5 MG tablet TAKE 1 TABLET BY MOUTH DAILY. PT. MUST MAKE AN APPOINTMENT IN ORDER TO RECEIVE FUTURE REFILLS.     Allergies:   Tizanidine   ROS:   Please see the history of present illness.    All other systems reviewed and are negative.  EKGs/Labs/Other Studies Reviewed:    The following studies were reviewed today: Cardiac Studies & Procedures   ______________________________________________________________________________________________ CARDIAC CATHETERIZATION  CARDIAC CATHETERIZATION 03/06/2019  Conclusion  Dist RCA lesion is 10% stenosed.  The left ventricular systolic function is normal.  LV end diastolic pressure is low.  The left ventricular ejection fraction is 55-65% by visual estimate.  No significant coronary obstructive disease with only minimal 10% plaque in the RCA in the region of the acute margin; normal-appearing LAD,  ramus intermediate vessel, and left circumflex vessel.  Normal LV function without focal segmental wall motion abnormalities.  EF estimate is 60 to 65%.  LVEDP is 6 mmHg.  RECOMMENDATION: The patient is currently under evaluation with Dr. Fernande is wearing a Zio patch monitor to document PAF or recurrent atrial arrhythmias.  A 2D echo Doppler study had been ordered but not yet done.  LVEDP is low.  Will hydrate post procedure.  Plan  discharge later today with follow-up with Dr. Kardie Tobb.  Findings Coronary Findings Diagnostic  Dominance: Right  Right Coronary Artery Dist RCA lesion is 10% stenosed.  Intervention  No interventions have been documented.   STRESS TESTS  EXERCISE TOLERANCE TEST (ETT) 08/27/2020  Interpretation Summary  Blood pressure demonstrated a hypertensive response to exercise.  Downsloping ST segment depression ST segment depression of 2 mm was noted during stress in the II, III, V4, V5, V6 and aVF leads, and returning to baseline after 1-5 minutes of recovery.   ECHOCARDIOGRAM  ECHOCARDIOGRAM COMPLETE 09/09/2021  Narrative ECHOCARDIOGRAM REPORT    Patient Name:   Wanda Freeman Date of Exam: 09/09/2021 Medical Rec #:  [id number]      Height:       67.0 in Accession #:    [id number]     Weight:       184.8 lb Date of Birth:  1951-02-04       BSA:          1.955 m Patient Age:    70 years       BP:           130/72 mmHg Patient Gender: F              HR:           62 bpm. Exam Location:  [address]  Procedure: 2D Echo, 3D Echo, Cardiac Doppler and Color Doppler  Indications:    R06.09 Dyspnea  History:        Patient has prior history of Echocardiogram examinations, most recent 03/15/2019. Arrythmias:PVC, PAC and Atrial Fibrillation, Signs/Symptoms:Chest Pain; Risk Factors:Dyslipidemia and Hypertension. Palpitations.  Sonographer:    Heather Hawks RDCS Referring Phys: [facility]  IMPRESSIONS   1. Left ventricular ejection fraction, by estimation, is 60 to 65%. Left ventricular ejection fraction by 3D volume is 65 %. The left ventricle has normal function. The left ventricle has no regional wall motion abnormalities. Left ventricular diastolic parameters are consistent with Grade I diastolic dysfunction (impaired relaxation). 2. Right ventricular systolic function is normal. The right ventricular size is normal. There is normal pulmonary artery systolic pressure. 3.  The mitral valve is abnormal billowing of the anterior mitral valve leaflet without true prolapse. Trivial mitral valve regurgitation. No evidence of mitral stenosis. 4. The aortic valve is tricuspid. Aortic valve regurgitation is mild. No aortic stenosis is present. Aortic regurgitation PHT measures 542 msec. 2D Vena Contract 1.8 mm  Comparison(s): Prior images reviewed side by side. Aortic regurgitation new from prior.  FINDINGS Left Ventricle: Left ventricular ejection fraction, by estimation, is 60 to 65%. Left ventricular ejection fraction by 3D volume is 65 %. The left ventricle has normal function. The left ventricle has no regional wall motion abnormalities. The left ventricular internal cavity size was normal in size. There is no left ventricular hypertrophy. Left ventricular diastolic parameters are consistent with Grade I diastolic dysfunction (impaired relaxation).  Right Ventricle: The right ventricular size is normal. No increase in right ventricular wall thickness.  Right ventricular systolic function is normal. There is normal pulmonary artery systolic pressure. The tricuspid regurgitant velocity is 2.39 m/s, and with an assumed right atrial pressure of 3 mmHg, the estimated right ventricular systolic pressure is 25.8 mmHg.  Left Atrium: Left atrial size was normal in size.  Right Atrium: Right atrial size was normal in size.  Pericardium: There is no evidence of pericardial effusion.  Mitral Valve: The mitral valve is abnormal. Trivial mitral valve regurgitation. No evidence of mitral valve stenosis.  Tricuspid Valve: The tricuspid valve is normal in structure. Tricuspid valve regurgitation is trivial. No evidence of tricuspid stenosis.  Aortic Valve: 1.8 mm. The aortic valve is tricuspid. Aortic valve regurgitation is mild. Aortic regurgitation PHT measures 542 msec. No aortic stenosis is present.  Pulmonic Valve: The pulmonic valve was normal in structure. Pulmonic valve  regurgitation is mild to moderate. No evidence of pulmonic stenosis.  Aorta: The aortic root and ascending aorta are structurally normal, with no evidence of dilitation.  Pulmonary Artery: Two Jets.  IAS/Shunts: No atrial level shunt detected by color flow Doppler.   LEFT VENTRICLE PLAX 2D LVIDd:         4.80 cm         Diastology LVIDs:         2.70 cm         LV e' medial:    6.96 cm/s LV PW:         0.80 cm         LV E/e' medial:  10.6 LV IVS:        0.80 cm         LV e' lateral:   13.70 cm/s LVOT diam:     2.40 cm         LV E/e' lateral: 5.4 LV SV:         94 LV SV Index:   48 LVOT Area:     4.52 cm        3D Volume EF LV 3D EF:    Left ventricul ar ejection fraction by 3D volume is 65 %.  3D Volume EF: 3D EF:        65 % LV EDV:       122 ml LV ESV:       43 ml LV SV:        79 ml  RIGHT VENTRICLE RV Basal diam:  3.90 cm RV S prime:     23.10 cm/s TAPSE (M-mode): 2.6 cm  LEFT ATRIUM             Index        RIGHT ATRIUM           Index LA diam:        4.20 cm 2.15 cm/m   RA Area:     21.10 cm LA Vol (A2C):   79.2 ml 40.50 ml/m  RA Volume:   62.90 ml  32.17 ml/m LA Vol (A4C):   47.4 ml 24.24 ml/m LA Biplane Vol: 62.2 ml 31.81 ml/m AORTIC VALVE LVOT Vmax:   87.50 cm/s LVOT Vmean:  56.850 cm/s LVOT VTI:    0.209 m AI PHT:      542 msec  AORTA Ao Root diam: 3.20 cm Ao Asc diam:  3.20 cm  MITRAL VALVE               TRICUSPID VALVE MV Area (PHT)  cm         TR  Peak grad:   22.8 mmHg MV Decel Time: 236 msec    TR Vmax:        239.00 cm/s MV E velocity: 74.10 cm/s MV A velocity: 62.25 cm/s  SHUNTS MV E/A ratio:  1.19        Systemic VTI:  0.21 m Systemic Diam: 2.40 cm  Stanly Leavens MD Electronically signed by Stanly Leavens MD Signature Date/Time: 09/09/2021/6:32:37 PM    Final    MONITORS  LONG TERM MONITOR (3-14 DAYS) 04/17/2019  Narrative Indication: atrial fib  Duration: 14d  Findings Triggered event  Sinus with  PAC PVCs rare PACs frequent 3.6% SVT nonsustained common but the longest run was 6 beats   CT SCANS  CT CORONARY MORPH W/CTA COR W/SCORE 11/20/2020  Addendum 11/21/2020  8:30 AM ADDENDUM REPORT: 11/21/2020 08:28  EXAM: OVER-READ INTERPRETATION  CT CHEST  The following report is an over-read performed by radiologist Dr. Rockey Kilts of [facility] on 11/21/2020. This over-read does not include interpretation of cardiac or coronary anatomy or pathology. The coronary CTA interpretation by the cardiologist is attached.  COMPARISON:  03/05/2019 chest radiograph from [facility]  FINDINGS: Vascular: Aortic atherosclerosis. No central pulmonary embolism, on this non-dedicated study.  Mediastinum/Nodes: No imaged thoracic adenopathy.  Lungs/Pleura: No pleural fluid.  Mild bibasilar scarring.  Upper Abdomen: Normal imaged portions of the liver, spleen, stomach.  Musculoskeletal: Mild osteopenia.  Lower thoracic spondylosis.  IMPRESSION: No acute findings in the imaged extracardiac chest.  Aortic Atherosclerosis (ICD10-I70.0).   Electronically Signed By: Rockey Kilts M.D. On: 11/21/2020 08:28  Narrative CLINICAL DATA:  Chest pain  EXAM: Cardiac CTA  MEDICATIONS: Sub lingual nitro. 4mg  and coreg  12.5 mg  TECHNIQUE: The patient was scanned on a Siemens Force 192 slice scanner. Gantry rotation speed was 250 msecs. Collimation was .6 mm. A 100 kV prospective scan was triggered in the ascending thoracic aorta at 140 HU's Full mA was used between 35% and 75% of the R-R interval. Average HR during the scan was 58 bpm. The 3D data set was interpreted on a dedicated work station using MPR, MIP and VRT modes. A total of 80cc of contrast was used.  FINDINGS: Non-cardiac: See separate report from [facility]. No significant findings on limited lung and soft tissue windows.  Calcium  Score: Minimal calcium  noted in proximal LAD/Circumflex and distal  RCA  Coronary Arteries: Right dominant with no anomalies  LM: Normal  LAD: 1-24% calcified plaque proximally  IM: Large vessel normal  D1: Normal small  D2: Normal small  Circumflex: 1-24% calcified plaque proximally  OM1: Normal  OM2: Normal  RCA: 1-24% calcified plaque distally  PDA: Normal  PLA: Normal  IMPRESSION: 1. Calcium  score 2.9 which is only 36 th percentile for age/sex  2.  CAD RADS 1 non obstructive coronary artery disease  3.  Normal aortic root 3.1 cm  4.  Possible small PFO  Patient will f/u with Dr Claudene for further w/u and Rx of presumed microvascular angina  Maude Emmer  Electronically Signed: By: Maude Emmer M.D. On: 11/20/2020 15:19     ______________________________________________________________________________________________      EKG:        Recent Labs: No results found for requested labs within last 365 days.  Recent Lipid Panel    Component Value Date/Time   CHOL 128 08/20/2021 1443   TRIG 74 08/20/2021 1443   HDL 57 08/20/2021 1443   CHOLHDL 2.2 08/20/2021 1443   LDLCALC 56 08/20/2021 1443  Risk Assessment/Calculations:                Physical Exam:    VS:  BP 122/68   Pulse 67   Ht 5' 7 (1.702 m)   Wt 171 lb 12.8 oz (77.9 kg)   SpO2 96%   BMI 26.91 kg/m     Wt Readings from Last 3 Encounters:  01/04/24 171 lb 12.8 oz (77.9 kg)  07/19/23 171 lb (77.6 kg)  04/19/23 171 lb 3.2 oz (77.7 kg)     GEN:  Well nourished, well developed in no acute distress HEENT: Normal NECK: No JVD; No carotid bruits LYMPHATICS: No lymphadenopathy CARDIAC: RRR, no murmurs, rubs, gallops RESPIRATORY:  Clear to auscultation without rales, wheezing or rhonchi  ABDOMEN: Soft, non-tender, non-distended MUSCULOSKELETAL:  No edema; No deformity  SKIN: Warm and dry NEUROLOGIC:  Alert and oriented x 3 PSYCHIATRIC:  Normal affect   Assessment & Plan Microvascular angina Reviewed treatment options with the  patient.  Reviewed outside evaluation at [facility] and discussed their recommendations with the patient as well.  Considering all of the things she has tried, she has opted to go back on ranolazine  at low-dose.  Her side effect of constipation can be treated and she would like to try this at a dose of 500 mg daily for 2 weeks and if tolerated will increase to 500 mg twice daily.  Check 2D echo to evaluate diastolic function again. Palpitations SVT controlled on low-dose flecainide  and Nebivolol .  Continue the same.      Medication Adjustments/Labs and Tests Ordered: Current medicines are reviewed at length with the patient today.  Concerns regarding medicines are outlined above.  Orders Placed This Encounter  Procedures   ECHOCARDIOGRAM COMPLETE   Meds ordered this encounter  Medications   DISCONTD: metFORMIN  (GLUCOPHAGE -XR) 750 MG 24 hr tablet    Sig: Take 1 tablet (750 mg total) by mouth 2 (two) times daily.    Dispense:  60 tablet    Refill:  3   nebivolol  (BYSTOLIC ) 5 MG tablet    Sig: Take 1 tablet (5 mg total) by mouth daily.    Dispense:  90 tablet    Refill:  3   ranolazine  (RANEXA ) 500 MG 12 hr tablet    Sig: Take 1 tablet (500 mg total) by mouth 2 (two) times daily.    Dispense:  60 tablet    Refill:  11   metFORMIN  (GLUCOPHAGE -XR) 750 MG 24 hr tablet    Sig: Take 1 tablet (750 mg total) by mouth 2 (two) times daily.    Dispense:  180 tablet    Refill:  3    Patient Instructions  Medication Instructions:  START Ranolazine  (Ranexa ) 500 mg twice daily   *If you need a refill on your cardiac medications before your next appointment, please call your pharmacy*  Lab Work: None ordered today. If you have labs (blood work) drawn today and your tests are completely normal, you will receive your results only by: MyChart Message (if you have MyChart) OR A paper copy in the mail If you have any lab test that is abnormal or we need to change your treatment, we will  call you to review the results.  Testing/Procedures: Your physician has requested that you have an echocardiogram. Echocardiography is a painless test that uses sound waves to create images of your heart. It provides your doctor with information about the size and shape of your heart and how well your heart's  chambers and valves are working. This procedure takes approximately one hour. There are no restrictions for this procedure. Please do NOT wear cologne, perfume, aftershave, or lotions (deodorant is allowed). Please arrive 15 minutes prior to your appointment time.  Please note: We ask at that you not bring children with you during ultrasound (echo/ vascular) testing. Due to room size and safety concerns, children are not allowed in the ultrasound rooms during exams. Our front office staff cannot provide observation of children in our lobby area while testing is being conducted. An adult accompanying a patient to their appointment will only be allowed in the ultrasound room at the discretion of the ultrasound technician under special circumstances. We apologize for any inconvenience.   Follow-Up: At [facility], you and your health needs are our priority.  As part of our continuing mission to provide you with exceptional heart care, our providers are all part of one team.  This team includes your primary Cardiologist (physician) and Advanced Practice Providers or APPs (Physician Assistants and Nurse Practitioners) who all work together to provide you with the care you need, when you need it.  Your next appointment:   6 month(s)  Provider:   Ozell Fell, MD      Signed, Ozell Fell, MD  01/04/2024 2:48 PM    [facility]

## 2024-01-05 NOTE — Progress Notes (Signed)
 Remote Loop Recorder Transmission

## 2024-01-09 ENCOUNTER — Ambulatory Visit: Payer: Self-pay | Admitting: Cardiology

## 2024-01-10 ENCOUNTER — Encounter

## 2024-01-11 ENCOUNTER — Ambulatory Visit: Admitting: Neurology

## 2024-01-25 ENCOUNTER — Telehealth: Payer: Self-pay | Admitting: *Deleted

## 2024-01-25 NOTE — Telephone Encounter (Signed)
 Alert remote transmission:  Symptom Event occurred 10/20 @ 20:40, EGM c/w SR with PVC's.  PVC's=0.1%.  Route to triage Follow up as scheduled. LA, CVRS _______________________________________________________________________________________  Attempted outreach to patient to assess for symptoms and possible causes of PVC's  Caffeine intake? Sleep habits? New Major Life Stressors? Exercising? Change in OTC medications?  No answer. Left message to call back to clinic  Routing to device pool for follow up with patient tomorrow if no call back today

## 2024-01-25 NOTE — Telephone Encounter (Signed)
 Pt called back and lvm wanting someone to return her call

## 2024-01-26 NOTE — Telephone Encounter (Signed)
 Spoke w/ patient regarding recent symptom activation and possible causes of PVC's. Patient states there is nothing new at this time that she can think of to be the reason for prevalence of PVC's noted. Current PVC burden 0.1%. States she does have symptoms w/ PVC's at times. States she currently feels fine w/ no complaints.   Device clinic number given and informed to call for any questions/concerns or if any new symptoms arise. Patient verbalized understanding. Will continue to monitor and update accordingly.

## 2024-02-03 ENCOUNTER — Ambulatory Visit

## 2024-02-03 DIAGNOSIS — R002 Palpitations: Secondary | ICD-10-CM | POA: Diagnosis not present

## 2024-02-03 LAB — CUP PACEART REMOTE DEVICE CHECK
Date Time Interrogation Session: 20251029230354
Implantable Pulse Generator Implant Date: 20210315

## 2024-02-09 NOTE — Progress Notes (Signed)
 Remote Loop Recorder Transmission

## 2024-02-10 ENCOUNTER — Encounter

## 2024-02-15 ENCOUNTER — Ambulatory Visit (HOSPITAL_COMMUNITY)
Admission: RE | Admit: 2024-02-15 | Discharge: 2024-02-15 | Disposition: A | Discharge status: Home / Self Care | Source: Ambulatory Visit | Attending: Cardiovascular Disease | Admitting: Cardiovascular Disease

## 2024-02-15 DIAGNOSIS — I2089 Other forms of angina pectoris: Secondary | ICD-10-CM

## 2024-02-15 LAB — ECHOCARDIOGRAM COMPLETE
Area-P 1/2: 4.36 cm2
P 1/2 time: 721 ms
S' Lateral: 2.8 cm

## 2024-02-17 ENCOUNTER — Ambulatory Visit: Payer: Self-pay | Admitting: Cardiovascular Disease

## 2024-03-05 ENCOUNTER — Ambulatory Visit: Attending: Cardiology

## 2024-03-05 DIAGNOSIS — I480 Paroxysmal atrial fibrillation: Secondary | ICD-10-CM | POA: Diagnosis not present

## 2024-03-06 ENCOUNTER — Encounter

## 2024-03-06 LAB — CUP PACEART REMOTE DEVICE CHECK
Date Time Interrogation Session: 20251129231109
Implantable Pulse Generator Implant Date: 20210315

## 2024-03-09 NOTE — Progress Notes (Signed)
 Remote Loop Recorder Transmission

## 2024-03-13 ENCOUNTER — Encounter

## 2024-03-24 ENCOUNTER — Ambulatory Visit: Payer: Self-pay | Admitting: Cardiology

## 2024-04-05 ENCOUNTER — Ambulatory Visit

## 2024-04-05 DIAGNOSIS — I480 Paroxysmal atrial fibrillation: Secondary | ICD-10-CM | POA: Diagnosis not present

## 2024-04-06 ENCOUNTER — Encounter

## 2024-04-06 LAB — CUP PACEART REMOTE DEVICE CHECK
Date Time Interrogation Session: 20251230230401
Implantable Pulse Generator Implant Date: 20210315

## 2024-04-09 ENCOUNTER — Ambulatory Visit: Payer: Self-pay | Admitting: Cardiology

## 2024-04-10 NOTE — Progress Notes (Signed)
 Remote Loop Recorder Transmission

## 2024-04-13 ENCOUNTER — Encounter

## 2024-05-06 ENCOUNTER — Ambulatory Visit: Attending: Cardiology

## 2024-05-06 DIAGNOSIS — I480 Paroxysmal atrial fibrillation: Secondary | ICD-10-CM

## 2024-05-08 LAB — CUP PACEART REMOTE DEVICE CHECK
Date Time Interrogation Session: 20260130230147
Implantable Pulse Generator Implant Date: 20210315

## 2024-05-12 NOTE — Progress Notes (Signed)
 Remote Loop Recorder Transmission

## 2024-06-06 ENCOUNTER — Ambulatory Visit

## 2024-07-07 ENCOUNTER — Ambulatory Visit

## 2024-07-24 ENCOUNTER — Ambulatory Visit: Admitting: Cardiovascular Disease

## 2024-08-07 ENCOUNTER — Ambulatory Visit

## 2024-09-07 ENCOUNTER — Ambulatory Visit

## 2024-10-08 ENCOUNTER — Ambulatory Visit

## 2024-11-08 ENCOUNTER — Ambulatory Visit

## 2024-12-09 ENCOUNTER — Ambulatory Visit

## 2025-01-09 ENCOUNTER — Ambulatory Visit

## 2025-02-09 ENCOUNTER — Ambulatory Visit

## 2025-03-12 ENCOUNTER — Ambulatory Visit

## 2025-04-12 ENCOUNTER — Ambulatory Visit
